# Patient Record
Sex: Male | Born: 1955 | Hispanic: No | Marital: Married | Smoking: Never smoker
Health system: Southern US, Community
[De-identification: ages and names within clinical notes are randomized; demographics above are authoritative.]

## PROBLEM LIST (undated history)

## (undated) DIAGNOSIS — I10 Essential (primary) hypertension: Secondary | ICD-10-CM

## (undated) DIAGNOSIS — F419 Anxiety disorder, unspecified: Secondary | ICD-10-CM

## (undated) DIAGNOSIS — E119 Type 2 diabetes mellitus without complications: Secondary | ICD-10-CM

## (undated) DIAGNOSIS — E0590 Thyrotoxicosis, unspecified without thyrotoxic crisis or storm: Secondary | ICD-10-CM

## (undated) DIAGNOSIS — M1990 Unspecified osteoarthritis, unspecified site: Secondary | ICD-10-CM

## (undated) HISTORY — PX: ANKLE SURGERY: SHX546

## (undated) HISTORY — PX: JOINT REPLACEMENT: SHX530

## (undated) HISTORY — PX: COLON SURGERY: SHX602

## (undated) HISTORY — PX: CORONARY ARTERY BYPASS GRAFT: SHX141

## (undated) HISTORY — PX: ROTATOR CUFF REPAIR: SHX139

## (undated) HISTORY — PX: TONSILLECTOMY: SUR1361

## (undated) HISTORY — PX: CARDIAC CATHETERIZATION: SHX172

---

## 2015-04-15 HISTORY — PX: BELOW KNEE LEG AMPUTATION: SUR23

## 2015-12-26 ENCOUNTER — Other Ambulatory Visit (INDEPENDENT_AMBULATORY_CARE_PROVIDER_SITE_OTHER): Payer: Self-pay | Admitting: Physician Assistant

## 2015-12-27 NOTE — Pre-Procedure Instructions (Addendum)
Francisco Ibarra  12/27/2015      [facility] - [place], [place] - [address] [place] [address] Phone: [phone] Fax: [phone]  [facility] - [place], [place] - [address] [place] [address] Phone: [phone] Fax: [phone]    Your procedure is scheduled on 01/02/16  [facility] Admitting 1:25 P.M.  Call this number if you have problems the morning of surgery:  [phone]   Remember:  Do not eat food or drink liquids after midnight.  Take these medicines the morning of surgery with A SIP OF WATER  Amlodipine(norvasc),cyclobenzaprine(flexeril) if needed,synthroid,lexapro,nexium,pain  med if needed,   STOP all herbel meds, nsaids (aleve,naproxen,advil,ibuprofen) 7 days prior to surgery starting Today including aspirin, all vitamins /supplements,meloxicam, fish oil  No metformin day of surgery  Follow instructions below about insulin    How to Manage Your Diabetes Before and After Surgery  Why is it important to control my blood sugar before and after surgery? . Improving blood sugar levels before and after surgery helps healing and can limit problems. . A way of improving blood sugar control is eating a healthy diet by: o  Eating less sugar and carbohydrates o  Increasing activity/exercise o  Talking with your doctor about reaching your blood sugar goals . High blood sugars (greater than 180 mg/dL) can raise your risk of infections and slow your recovery, so you will need to focus on controlling your diabetes during the weeks before surgery. . Make sure that the doctor who takes care of your diabetes knows about your planned surgery including the date and location.  How do I manage my blood sugar before surgery? . Check your blood sugar at least 4 times a day, starting 2 days before surgery, to make sure  that the level is not too high or low. o Check your blood sugar the morning of your surgery when you wake up and every 2 hours until you get to the Short Stay unit. . If your blood sugar is less than 70 mg/dL, you will need to treat for low blood sugar: o Do not take insulin. o Treat a low blood sugar (less than 70 mg/dL) with  cup of clear juice (cranberry or apple), 4 glucose tablets, OR glucose gel. o Recheck blood sugar in 15 minutes after treatment (to make sure it is greater than 70 mg/dL). If your blood sugar is not greater than 70 mg/dL on recheck, call [phone] for further instructions. . Report your blood sugar to the short stay nurse when you get to Short Stay.  . If you are admitted to the hospital after surgery: o Your blood sugar will be checked by the staff and you will probably be given insulin after surgery (instead of oral diabetes medicines) to make sure you have good blood sugar levels. o The goal for blood sugar control after surgery is 80-180 mg/dL.  WHAT DO I DO ABOUT MY DIABETES MEDICATION?  Patient to discuss with Endocrinologist about insulin pump   Other Instructions:          Patient Signature:  Date:   Nurse Signature:  Date:   Reviewed and Endorsed by [facility] Patient Education Committee, August 2015    Do not wear jewelry  Do not wear lotions, powders, or cologne, or deodorant.  Men may shave face and neck.  Do not bring valuables to the  hospital.  [facility] is not responsible for any belongings or valuables.  Contacts, dentures or bridgework may not be worn into surgery.  Leave your suitcase in the car.  After surgery it may be brought to your room.  For patients admitted to the hospital, discharge time will be determined by your treatment team.  Patients discharged the day of surgery will not be allowed to drive home.   Special instructions:   Special Instructions: [place] - Preparing for Surgery  Before surgery, you can play  an important role.  Because skin is not sterile, your skin needs to be as free of germs as possible.  You can reduce the number of germs on you skin by washing with CHG (chlorahexidine gluconate) soap before surgery.  CHG is an antiseptic cleaner which kills germs and bonds with the skin to continue killing germs even after washing.  Please DO NOT use if you have an allergy to CHG or antibacterial soaps.  If your skin becomes reddened/irritated stop using the CHG and inform your nurse when you arrive at Short Stay.  Do not shave (including legs and underarms) for at least 48 hours prior to the first CHG shower.  You may shave your face.  Please follow these instructions carefully:   1.  Shower with CHG Soap the night before surgery and the morning of Surgery.  2.  If you choose to wash your hair, wash your hair first as usual with your normal shampoo.  3.  After you shampoo, rinse your hair and body thoroughly to remove the Shampoo.  4.  Use CHG as you would any other liquid soap.  You can apply chg directly  to the skin and wash gently with scrungie or a clean washcloth.  5.  Apply the CHG Soap to your body ONLY FROM THE NECK DOWN.  Do not use on open wounds or open sores.  Avoid contact with your eyes ears, mouth and genitals (private parts).  Wash genitals (private parts)       with your normal soap.  6.  Wash thoroughly, paying special attention to the area where your surgery will be performed.  7.  Thoroughly rinse your body with warm water from the neck down.  8.  DO NOT shower/wash with your normal soap after using and rinsing off the CHG Soap.  9.  Pat yourself dry with a clean towel.            10.  Wear clean pajamas.            11.  Place clean sheets on your bed the night of your first shower and do not sleep with pets.  Day of Surgery  Do not apply any lotions/deodorants the morning of surgery.  Please wear clean clothes to the hospital/surgery center.  Please read over the fact  sheets that you were given.

## 2015-12-28 ENCOUNTER — Encounter (HOSPITAL_COMMUNITY): Payer: Self-pay

## 2015-12-28 ENCOUNTER — Encounter (HOSPITAL_COMMUNITY)
Admission: RE | Admit: 2015-12-28 | Discharge: 2015-12-28 | Disposition: A | Discharge status: Home / Self Care | Payer: BLUE CROSS/BLUE SHIELD | Source: Ambulatory Visit | Attending: Orthopaedic Surgery | Admitting: Orthopaedic Surgery

## 2015-12-28 DIAGNOSIS — E119 Type 2 diabetes mellitus without complications: Secondary | ICD-10-CM | POA: Insufficient documentation

## 2015-12-28 DIAGNOSIS — Z01818 Encounter for other preprocedural examination: Secondary | ICD-10-CM | POA: Insufficient documentation

## 2015-12-28 DIAGNOSIS — I10 Essential (primary) hypertension: Secondary | ICD-10-CM | POA: Diagnosis not present

## 2015-12-28 HISTORY — DX: Unspecified osteoarthritis, unspecified site: M19.90

## 2015-12-28 HISTORY — DX: Type 2 diabetes mellitus without complications: E11.9

## 2015-12-28 HISTORY — DX: Thyrotoxicosis, unspecified without thyrotoxic crisis or storm: E05.90

## 2015-12-28 HISTORY — DX: Essential (primary) hypertension: I10

## 2015-12-28 HISTORY — DX: Anxiety disorder, unspecified: F41.9

## 2015-12-28 LAB — BASIC METABOLIC PANEL
ANION GAP: 12 (ref 5–15)
BUN: 14 mg/dL (ref 6–20)
CALCIUM: 10.1 mg/dL (ref 8.9–10.3)
CO2: 21 mmol/L — ABNORMAL LOW (ref 22–32)
Chloride: 103 mmol/L (ref 101–111)
Creatinine, Ser: 1.02 mg/dL (ref 0.61–1.24)
GLUCOSE: 145 mg/dL — AB (ref 65–99)
POTASSIUM: 5 mmol/L (ref 3.5–5.1)
Sodium: 136 mmol/L (ref 135–145)

## 2015-12-28 LAB — CBC
HCT: 40.4 % (ref 39.0–52.0)
Hemoglobin: 14 g/dL (ref 13.0–17.0)
MCH: 31.5 pg (ref 26.0–34.0)
MCHC: 34.7 g/dL (ref 30.0–36.0)
MCV: 91 fL (ref 78.0–100.0)
PLATELETS: 139 10*3/uL — AB (ref 150–400)
RBC: 4.44 MIL/uL (ref 4.22–5.81)
RDW: 14.7 % (ref 11.5–15.5)
WBC: 10.4 10*3/uL (ref 4.0–10.5)

## 2015-12-28 LAB — SURGICAL PCR SCREEN
MRSA, PCR: NEGATIVE
STAPHYLOCOCCUS AUREUS: NEGATIVE

## 2015-12-28 LAB — GLUCOSE, CAPILLARY: Glucose-Capillary: 133 mg/dL — ABNORMAL HIGH (ref 65–99)

## 2015-12-28 NOTE — Progress Notes (Addendum)
[place] dr[facility]p" [place] [facility] [place]. Has had a stress test, echo, and cath through [facility]. Records requested.  Endocrinologist is Dr. Jolyne LoaLeavy. States average blood sugar 119. Patient stated he wanted to call endocrinologist today to find out what to do about his insulin pump. [facility] [place] [facility] [place].

## 2015-12-28 NOTE — Progress Notes (Signed)
   12/28/15 0857  OBSTRUCTIVE SLEEP APNEA  Have you ever been diagnosed with sleep apnea through a sleep study? Yes  If yes, do you have and use a CPAP or BPAP machine every night? 0  Do you snore loudly (loud enough to be heard through closed doors)?  1  Do you often feel tired, fatigued, or sleepy during the daytime (such as falling asleep during driving or talking to someone)? 0  Has anyone observed you stop breathing during your sleep? 0  Do you have, or are you being treated for high blood pressure? 1  BMI more than 35 kg/m2? 1  Age > 50 (1-yes) 1  Neck circumference greater than:Male 16 inches or larger, Male 17inches or larger? 1  Male Gender (Yes=1) 1  Obstructive Sleep Apnea Score 6  Score 5 or greater  Results sent to PCP

## 2015-12-29 LAB — HEMOGLOBIN A1C
HEMOGLOBIN A1C: 6.3 % — AB (ref 4.8–5.6)
Mean Plasma Glucose: 134 mg/dL

## 2015-12-29 NOTE — Progress Notes (Signed)
Anesthesia Chart Review:  Pt is a 60 year old male scheduled for R total hip arthroplasty on 01/02/2016 with Doneen Poissonhristopher Blackman, MD.   - Endocrinologist is Crista CurbMatthew Levy, MD (notes in care everywhere) - [facility]  PMH includes:  CAD ([facility] [place] HTN, DM, hypothyroidism. Never smoker. BMI 39. 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   Medications include: amlodipine, ASA, lipitor, nexium, humalog on an insulin pump, lisinopril, metformin, synthroid, victoza.   Preoperative labs reviewed.  HgbA1c 6.3, glucose 145  EKG 12/28/15: NSR. LAD.   Stress echo 06/17/13:   Normal resting wall motion and no stress-induced wall motion abnormality. No inducible ischemia. The study was technically difficult. LV is normal in size. Mild concentric LVH with normal wall motion and EF 55-60%. Unable to adequately determine diastolic dysfunction. RVEF is normal.  [facility] but he hasn't seen cardiology since 2015.  Appears last office visit was 03/2013 [place] (notes in care everywhere). Denies active CV sx.   Reviewed case with Dr. Aleene DavidsonE. Fitzgerald. If no changes, I anticipate pt can proceed with surgery as scheduled.   Rica Mastngela Bartosz Luginbill, FNP-BC [facility]iology Phone: [phone] 12/29/2015 5:02 PM

## 2016-01-01 MED ORDER — DEXTROSE 5 % IV SOLN
3.0000 g | INTRAVENOUS | Status: AC
  Administered 2016-01-02: 15:00:00 via INTRAVENOUS
  Administered 2016-01-02: 1 g via INTRAVENOUS
  Filled 2016-01-01: qty 3000

## 2016-01-02 ENCOUNTER — Inpatient Hospital Stay (HOSPITAL_COMMUNITY): Payer: BLUE CROSS/BLUE SHIELD | Admitting: Anesthesiology

## 2016-01-02 ENCOUNTER — Inpatient Hospital Stay (HOSPITAL_COMMUNITY): Payer: BLUE CROSS/BLUE SHIELD

## 2016-01-02 ENCOUNTER — Inpatient Hospital Stay (HOSPITAL_COMMUNITY): Payer: BLUE CROSS/BLUE SHIELD | Admitting: Emergency Medicine

## 2016-01-02 ENCOUNTER — Encounter (HOSPITAL_COMMUNITY)
Admission: RE | Disposition: A | Discharge status: Home Health | Payer: Self-pay | Source: Ambulatory Visit | Attending: Orthopaedic Surgery

## 2016-01-02 ENCOUNTER — Encounter (HOSPITAL_COMMUNITY): Payer: Self-pay | Admitting: *Deleted

## 2016-01-02 ENCOUNTER — Inpatient Hospital Stay (HOSPITAL_COMMUNITY)
Admission: RE | Admit: 2016-01-02 | Discharge: 2016-01-05 | DRG: 470 | Disposition: A | Discharge status: Home Health | Payer: BLUE CROSS/BLUE SHIELD | Source: Ambulatory Visit | Attending: Orthopaedic Surgery | Admitting: Orthopaedic Surgery

## 2016-01-02 DIAGNOSIS — Z7982 Long term (current) use of aspirin: Secondary | ICD-10-CM

## 2016-01-02 DIAGNOSIS — Z89512 Acquired absence of left leg below knee: Secondary | ICD-10-CM

## 2016-01-02 DIAGNOSIS — M1611 Unilateral primary osteoarthritis, right hip: Principal | ICD-10-CM

## 2016-01-02 DIAGNOSIS — E0590 Thyrotoxicosis, unspecified without thyrotoxic crisis or storm: Secondary | ICD-10-CM | POA: Diagnosis present

## 2016-01-02 DIAGNOSIS — E669 Obesity, unspecified: Secondary | ICD-10-CM | POA: Diagnosis present

## 2016-01-02 DIAGNOSIS — E119 Type 2 diabetes mellitus without complications: Secondary | ICD-10-CM | POA: Diagnosis present

## 2016-01-02 DIAGNOSIS — Z6839 Body mass index (BMI) 39.0-39.9, adult: Secondary | ICD-10-CM | POA: Diagnosis not present

## 2016-01-02 DIAGNOSIS — Z79899 Other long term (current) drug therapy: Secondary | ICD-10-CM | POA: Diagnosis not present

## 2016-01-02 DIAGNOSIS — Z79891 Long term (current) use of opiate analgesic: Secondary | ICD-10-CM | POA: Diagnosis not present

## 2016-01-02 DIAGNOSIS — Z885 Allergy status to narcotic agent status: Secondary | ICD-10-CM | POA: Diagnosis not present

## 2016-01-02 DIAGNOSIS — Z794 Long term (current) use of insulin: Secondary | ICD-10-CM

## 2016-01-02 DIAGNOSIS — F419 Anxiety disorder, unspecified: Secondary | ICD-10-CM | POA: Diagnosis present

## 2016-01-02 DIAGNOSIS — Z419 Encounter for procedure for purposes other than remedying health state, unspecified: Secondary | ICD-10-CM

## 2016-01-02 DIAGNOSIS — Z96641 Presence of right artificial hip joint: Secondary | ICD-10-CM

## 2016-01-02 DIAGNOSIS — I10 Essential (primary) hypertension: Secondary | ICD-10-CM | POA: Diagnosis present

## 2016-01-02 HISTORY — PX: TOTAL HIP ARTHROPLASTY: SHX124

## 2016-01-02 LAB — GLUCOSE, CAPILLARY
GLUCOSE-CAPILLARY: 146 mg/dL — AB (ref 65–99)
Glucose-Capillary: 223 mg/dL — ABNORMAL HIGH (ref 65–99)
Glucose-Capillary: 148 mg/dL — ABNORMAL HIGH (ref 65–99)

## 2016-01-02 SURGERY — ARTHROPLASTY, HIP, TOTAL, ANTERIOR APPROACH
Anesthesia: Monitor Anesthesia Care | Laterality: Right

## 2016-01-02 MED ORDER — CHLORHEXIDINE GLUCONATE 4 % EX LIQD: 60.0000 mL | Freq: Once | CUTANEOUS | Status: DC

## 2016-01-02 MED ORDER — PROMETHAZINE HCL 25 MG/ML IJ SOLN: 6.2500 mg | INTRAMUSCULAR | Status: DC | PRN

## 2016-01-02 MED ORDER — HYDROMORPHONE HCL 1 MG/ML IJ SOLN: 1.0000 mg | INTRAMUSCULAR | Status: DC | PRN

## 2016-01-02 MED ORDER — LIDOCAINE 2% (20 MG/ML) 5 ML SYRINGE
INTRAMUSCULAR | Status: AC
  Filled 2016-01-02: qty 5

## 2016-01-02 MED ORDER — PHENYLEPHRINE 40 MCG/ML (10ML) SYRINGE FOR IV PUSH (FOR BLOOD PRESSURE SUPPORT)
PREFILLED_SYRINGE | INTRAVENOUS | Status: DC | PRN
  Administered 2016-01-02: 80 ug via INTRAVENOUS

## 2016-01-02 MED ORDER — AMITRIPTYLINE HCL 10 MG PO TABS
10.0000 mg | ORAL_TABLET | Freq: Every day | ORAL | Status: DC
  Administered 2016-01-02 – 2016-01-04 (×3): 10 mg via ORAL
  Filled 2016-01-02 (×3): qty 1

## 2016-01-02 MED ORDER — ZOLPIDEM TARTRATE 5 MG PO TABS: 5.0000 mg | ORAL_TABLET | Freq: Every evening | ORAL | Status: DC | PRN

## 2016-01-02 MED ORDER — METOCLOPRAMIDE HCL 5 MG PO TABS
5.0000 mg | ORAL_TABLET | Freq: Three times a day (TID) | ORAL | Status: DC | PRN
Start: 2016-01-02 — End: 2016-01-05

## 2016-01-02 MED ORDER — ASPIRIN 81 MG PO CHEW
81.0000 mg | CHEWABLE_TABLET | Freq: Two times a day (BID) | ORAL | Status: DC
  Administered 2016-01-02 – 2016-01-05 (×6): 81 mg via ORAL
  Filled 2016-01-02 (×6): qty 1

## 2016-01-02 MED ORDER — BUPIVACAINE IN DEXTROSE 0.75-8.25 % IT SOLN
INTRATHECAL | Status: DC | PRN
  Administered 2016-01-02: 15 mg via INTRATHECAL

## 2016-01-02 MED ORDER — LEVOTHYROXINE SODIUM 100 MCG PO TABS
200.0000 ug | ORAL_TABLET | Freq: Every day | ORAL | Status: DC
  Administered 2016-01-03 – 2016-01-05 (×3): 200 ug via ORAL
  Filled 2016-01-02 (×3): qty 2

## 2016-01-02 MED ORDER — METHOCARBAMOL 1000 MG/10ML IJ SOLN
500.0000 mg | Freq: Four times a day (QID) | INTRAVENOUS | Status: DC | PRN
  Filled 2016-01-02: qty 5

## 2016-01-02 MED ORDER — LIRAGLUTIDE 18 MG/3ML ~~LOC~~ SOPN
1.8000 mg | PEN_INJECTOR | Freq: Every day | SUBCUTANEOUS | Status: DC
  Administered 2016-01-03 – 2016-01-05 (×3): 1.8 mg via SUBCUTANEOUS

## 2016-01-02 MED ORDER — METFORMIN HCL ER 500 MG PO TB24
1000.0000 mg | ORAL_TABLET | Freq: Two times a day (BID) | ORAL | Status: DC
  Administered 2016-01-03 – 2016-01-05 (×5): 1000 mg via ORAL
  Filled 2016-01-02 (×5): qty 2

## 2016-01-02 MED ORDER — ONDANSETRON HCL 4 MG/2ML IJ SOLN: 4.0000 mg | Freq: Four times a day (QID) | INTRAMUSCULAR | Status: DC | PRN

## 2016-01-02 MED ORDER — DIPHENHYDRAMINE HCL 12.5 MG/5ML PO ELIX
12.5000 mg | ORAL_SOLUTION | ORAL | Status: DC | PRN
  Filled 2016-01-02: qty 10

## 2016-01-02 MED ORDER — ESCITALOPRAM OXALATE 10 MG PO TABS
10.0000 mg | ORAL_TABLET | Freq: Every day | ORAL | Status: DC
  Administered 2016-01-02 – 2016-01-05 (×4): 10 mg via ORAL
  Filled 2016-01-02 (×4): qty 1

## 2016-01-02 MED ORDER — ACETAMINOPHEN 650 MG RE SUPP: 650.0000 mg | Freq: Four times a day (QID) | RECTAL | Status: DC | PRN

## 2016-01-02 MED ORDER — HYDROMORPHONE HCL 2 MG/ML IJ SOLN
1.0000 mg | INTRAMUSCULAR | Status: DC | PRN
Start: 2016-01-02 — End: 2016-01-05
  Administered 2016-01-02: 1 mg via INTRAVENOUS
  Filled 2016-01-02: qty 1

## 2016-01-02 MED ORDER — FENTANYL CITRATE (PF) 100 MCG/2ML IJ SOLN
INTRAMUSCULAR | Status: DC | PRN
Start: 2016-01-02 — End: 2016-01-02
  Administered 2016-01-02: 100 ug via INTRAVENOUS

## 2016-01-02 MED ORDER — LACTATED RINGERS IV SOLN
INTRAVENOUS | Status: DC
  Administered 2016-01-02 (×2): via INTRAVENOUS

## 2016-01-02 MED ORDER — PHENOL 1.4 % MT LIQD: 1.0000 | OROMUCOSAL | Status: DC | PRN

## 2016-01-02 MED ORDER — PHENYLEPHRINE HCL 10 MG/ML IJ SOLN
INTRAMUSCULAR | Status: AC
  Filled 2016-01-02: qty 1

## 2016-01-02 MED ORDER — DOCUSATE SODIUM 100 MG PO CAPS
100.0000 mg | ORAL_CAPSULE | Freq: Two times a day (BID) | ORAL | Status: DC
  Administered 2016-01-02 – 2016-01-05 (×6): 100 mg via ORAL
  Filled 2016-01-02 (×6): qty 1

## 2016-01-02 MED ORDER — INSULIN PUMP
Freq: Three times a day (TID) | SUBCUTANEOUS | Status: DC
  Administered 2016-01-02: 22:00:00 via SUBCUTANEOUS
  Administered 2016-01-03: 25 via SUBCUTANEOUS
  Administered 2016-01-03: 37 via SUBCUTANEOUS
  Administered 2016-01-03: 12 via SUBCUTANEOUS
  Administered 2016-01-03: 28 via SUBCUTANEOUS
  Administered 2016-01-04: 13 via SUBCUTANEOUS
  Administered 2016-01-04: 25 via SUBCUTANEOUS
  Administered 2016-01-04 – 2016-01-05 (×2): via SUBCUTANEOUS
  Administered 2016-01-05: 3 via SUBCUTANEOUS
  Filled 2016-01-02: qty 1

## 2016-01-02 MED ORDER — HYDROMORPHONE HCL 1 MG/ML IJ SOLN: 0.2500 mg | INTRAMUSCULAR | Status: DC | PRN

## 2016-01-02 MED ORDER — ACETAMINOPHEN 325 MG PO TABS
650.0000 mg | ORAL_TABLET | Freq: Four times a day (QID) | ORAL | Status: DC | PRN
  Administered 2016-01-03: 650 mg via ORAL
  Filled 2016-01-02: qty 2

## 2016-01-02 MED ORDER — CEFAZOLIN SODIUM-DEXTROSE 2-4 GM/100ML-% IV SOLN
2.0000 g | Freq: Four times a day (QID) | INTRAVENOUS | Status: AC
Start: 2016-01-02 — End: 2016-01-03
  Administered 2016-01-02 – 2016-01-03 (×2): 2 g via INTRAVENOUS
  Filled 2016-01-02 (×2): qty 100

## 2016-01-02 MED ORDER — GABAPENTIN 400 MG PO CAPS
800.0000 mg | ORAL_CAPSULE | Freq: Three times a day (TID) | ORAL | Status: DC
  Administered 2016-01-02 – 2016-01-05 (×8): 800 mg via ORAL
  Filled 2016-01-02 (×8): qty 2

## 2016-01-02 MED ORDER — ADULT MULTIVITAMIN W/MINERALS CH
1.0000 | ORAL_TABLET | Freq: Every day | ORAL | Status: DC
  Administered 2016-01-02 – 2016-01-05 (×4): 1 via ORAL
  Filled 2016-01-02 (×4): qty 1

## 2016-01-02 MED ORDER — FENTANYL CITRATE (PF) 100 MCG/2ML IJ SOLN
INTRAMUSCULAR | Status: AC
  Filled 2016-01-02: qty 2

## 2016-01-02 MED ORDER — ALUM & MAG HYDROXIDE-SIMETH 200-200-20 MG/5ML PO SUSP: 30.0000 mL | ORAL | Status: DC | PRN

## 2016-01-02 MED ORDER — METHOCARBAMOL 500 MG PO TABS
500.0000 mg | ORAL_TABLET | Freq: Four times a day (QID) | ORAL | Status: DC | PRN
  Administered 2016-01-03 – 2016-01-05 (×6): 500 mg via ORAL
  Filled 2016-01-02 (×6): qty 1

## 2016-01-02 MED ORDER — ONDANSETRON HCL 4 MG PO TABS: 4.0000 mg | ORAL_TABLET | Freq: Four times a day (QID) | ORAL | Status: DC | PRN

## 2016-01-02 MED ORDER — 0.9 % SODIUM CHLORIDE (POUR BTL) OPTIME
TOPICAL | Status: DC | PRN
  Administered 2016-01-02: 1000 mL

## 2016-01-02 MED ORDER — MIDAZOLAM HCL 2 MG/2ML IJ SOLN
INTRAMUSCULAR | Status: AC
  Filled 2016-01-02: qty 2

## 2016-01-02 MED ORDER — SODIUM CHLORIDE 0.9 % IV SOLN
INTRAVENOUS | Status: DC
  Administered 2016-01-03: 02:00:00 via INTRAVENOUS

## 2016-01-02 MED ORDER — MENTHOL 3 MG MT LOZG: 1.0000 | LOZENGE | OROMUCOSAL | Status: DC | PRN

## 2016-01-02 MED ORDER — ZOLPIDEM TARTRATE 5 MG PO TABS
10.0000 mg | ORAL_TABLET | Freq: Every day | ORAL | Status: DC
  Administered 2016-01-02 – 2016-01-04 (×3): 10 mg via ORAL
  Filled 2016-01-02 (×3): qty 2

## 2016-01-02 MED ORDER — OXYCODONE HCL 5 MG PO TABS
5.0000 mg | ORAL_TABLET | ORAL | Status: DC | PRN
  Administered 2016-01-03 – 2016-01-04 (×8): 10 mg via ORAL
  Filled 2016-01-02 (×8): qty 2

## 2016-01-02 MED ORDER — PHENYLEPHRINE HCL 10 MG/ML IJ SOLN
INTRAMUSCULAR | Status: DC | PRN
  Administered 2016-01-02: 40 ug/min via INTRAVENOUS

## 2016-01-02 MED ORDER — PANTOPRAZOLE SODIUM 40 MG PO TBEC
40.0000 mg | DELAYED_RELEASE_TABLET | Freq: Every day | ORAL | Status: DC
  Administered 2016-01-03 – 2016-01-05 (×3): 40 mg via ORAL
  Filled 2016-01-02 (×3): qty 1

## 2016-01-02 MED ORDER — PROPOFOL 500 MG/50ML IV EMUL
INTRAVENOUS | Status: DC | PRN
  Administered 2016-01-02: 50 ug/kg/min via INTRAVENOUS

## 2016-01-02 MED ORDER — ATORVASTATIN CALCIUM 40 MG PO TABS
40.0000 mg | ORAL_TABLET | Freq: Every day | ORAL | Status: DC
  Administered 2016-01-02 – 2016-01-04 (×3): 40 mg via ORAL
  Filled 2016-01-02 (×3): qty 1

## 2016-01-02 MED ORDER — AMLODIPINE BESYLATE 5 MG PO TABS
5.0000 mg | ORAL_TABLET | Freq: Every day | ORAL | Status: DC
  Administered 2016-01-02 – 2016-01-04 (×3): 5 mg via ORAL
  Filled 2016-01-02 (×3): qty 1

## 2016-01-02 MED ORDER — SODIUM CHLORIDE 0.9 % IR SOLN
Status: DC | PRN
  Administered 2016-01-02: 3000 mL

## 2016-01-02 MED ORDER — METOCLOPRAMIDE HCL 5 MG/ML IJ SOLN: 5.0000 mg | Freq: Three times a day (TID) | INTRAMUSCULAR | Status: DC | PRN

## 2016-01-02 MED ORDER — MIDAZOLAM HCL 5 MG/5ML IJ SOLN
INTRAMUSCULAR | Status: DC | PRN
  Administered 2016-01-02: 2 mg via INTRAVENOUS

## 2016-01-02 MED ORDER — KETOROLAC TROMETHAMINE 15 MG/ML IJ SOLN
7.5000 mg | Freq: Four times a day (QID) | INTRAMUSCULAR | Status: AC
  Administered 2016-01-02 – 2016-01-03 (×4): 7.5 mg via INTRAVENOUS
  Filled 2016-01-02 (×3): qty 1

## 2016-01-02 SURGICAL SUPPLY — 51 items
BENZOIN TINCTURE PRP APPL 2/3 (GAUZE/BANDAGES/DRESSINGS) ×2 IMPLANT
BLADE SAW SGTL 18X1.27X75 (BLADE) ×2 IMPLANT
BLADE SURG ROTATE 9660 (MISCELLANEOUS) IMPLANT
CAPT HIP TOTAL 2 ×2 IMPLANT
CELLS DAT CNTRL 66122 CELL SVR (MISCELLANEOUS) ×1 IMPLANT
CLSR STERI-STRIP ANTIMIC 1/2X4 (GAUZE/BANDAGES/DRESSINGS) ×2 IMPLANT
COVER SURGICAL LIGHT HANDLE (MISCELLANEOUS) ×2 IMPLANT
DRAPE C-ARM 42X72 X-RAY (DRAPES) ×2 IMPLANT
DRAPE STERI IOBAN 125X83 (DRAPES) ×2 IMPLANT
DRAPE U-SHAPE 47X51 STRL (DRAPES) ×6 IMPLANT
DRSG AQUACEL AG ADV 3.5X10 (GAUZE/BANDAGES/DRESSINGS) ×2 IMPLANT
DURAPREP 26ML APPLICATOR (WOUND CARE) ×2 IMPLANT
ELECT BLADE 4.0 EZ CLEAN MEGAD (MISCELLANEOUS) ×2
ELECT BLADE 6.5 EXT (BLADE) IMPLANT
ELECT REM PT RETURN 9FT ADLT (ELECTROSURGICAL) ×2
ELECTRODE BLDE 4.0 EZ CLN MEGD (MISCELLANEOUS) ×1 IMPLANT
ELECTRODE REM PT RTRN 9FT ADLT (ELECTROSURGICAL) ×1 IMPLANT
FACESHIELD WRAPAROUND (MASK) ×4 IMPLANT
GAUZE XEROFORM 1X8 LF (GAUZE/BANDAGES/DRESSINGS) ×2 IMPLANT
GLOVE BIOGEL PI IND STRL 8 (GLOVE) ×2 IMPLANT
GLOVE BIOGEL PI INDICATOR 8 (GLOVE) ×2
GLOVE ECLIPSE 8.0 STRL XLNG CF (GLOVE) ×2 IMPLANT
GLOVE ORTHO TXT STRL SZ7.5 (GLOVE) ×4 IMPLANT
GOWN STRL REUS W/ TWL LRG LVL3 (GOWN DISPOSABLE) ×2 IMPLANT
GOWN STRL REUS W/ TWL XL LVL3 (GOWN DISPOSABLE) ×2 IMPLANT
GOWN STRL REUS W/TWL LRG LVL3 (GOWN DISPOSABLE) ×2
GOWN STRL REUS W/TWL XL LVL3 (GOWN DISPOSABLE) ×2
HANDPIECE INTERPULSE COAX TIP (DISPOSABLE) ×1
KIT BASIN OR (CUSTOM PROCEDURE TRAY) ×2 IMPLANT
KIT ROOM TURNOVER OR (KITS) ×2 IMPLANT
MANIFOLD NEPTUNE II (INSTRUMENTS) ×2 IMPLANT
NS IRRIG 1000ML POUR BTL (IV SOLUTION) ×2 IMPLANT
PACK TOTAL JOINT (CUSTOM PROCEDURE TRAY) ×2 IMPLANT
PAD ARMBOARD 7.5X6 YLW CONV (MISCELLANEOUS) ×2 IMPLANT
RTRCTR WOUND ALEXIS 18CM MED (MISCELLANEOUS) ×2
SET HNDPC FAN SPRY TIP SCT (DISPOSABLE) ×1 IMPLANT
STAPLER VISISTAT 35W (STAPLE) IMPLANT
STRIP CLOSURE SKIN 1/2X4 (GAUZE/BANDAGES/DRESSINGS) ×4 IMPLANT
SUT ETHIBOND NAB CT1 #1 30IN (SUTURE) ×2 IMPLANT
SUT MNCRL AB 4-0 PS2 18 (SUTURE) IMPLANT
SUT VIC AB 0 CT1 27 (SUTURE) ×1
SUT VIC AB 0 CT1 27XBRD ANBCTR (SUTURE) ×1 IMPLANT
SUT VIC AB 1 CT1 27 (SUTURE) ×1
SUT VIC AB 1 CT1 27XBRD ANBCTR (SUTURE) ×1 IMPLANT
SUT VIC AB 2-0 CT1 27 (SUTURE) ×1
SUT VIC AB 2-0 CT1 TAPERPNT 27 (SUTURE) ×1 IMPLANT
TOWEL OR 17X24 6PK STRL BLUE (TOWEL DISPOSABLE) ×2 IMPLANT
TOWEL OR 17X26 10 PK STRL BLUE (TOWEL DISPOSABLE) ×2 IMPLANT
TRAY CATH 16FR W/PLASTIC CATH (SET/KITS/TRAYS/PACK) ×2 IMPLANT
TRAY FOLEY CATH 16FRSI W/METER (SET/KITS/TRAYS/PACK) IMPLANT
WATER STERILE IRR 1000ML POUR (IV SOLUTION) IMPLANT

## 2016-01-02 NOTE — H&P (Signed)
TOTAL HIP ADMISSION H&P  Patient is admitted for right total hip arthroplasty.  Subjective:  Chief Complaint: right hip pain  HPI: Francisco Ibarra, 60 y.o. male, has a history of pain and functional disability in the right hip(s) due to arthritis and patient has failed non-surgical conservative treatments for greater than 12 weeks to include NSAID's and/or analgesics, corticosteriod injections, flexibility and strengthening excercises, use of assistive devices, weight reduction as appropriate and activity modification.  Onset of symptoms was gradual starting 2 years ago with rapidlly worsening course since that time.The patient noted no past surgery on the right hip(s).  Patient currently rates pain in the right hip at 10 out of 10 with activity. Patient has night pain, worsening of pain with activity and weight bearing, trendelenberg gait, pain that interfers with activities of daily living and pain with passive range of motion. Patient has evidence of subchondral cysts, subchondral sclerosis, periarticular osteophytes and joint space narrowing.by imaging studies. This condition presents safety issues increasing the risk of falls.  There is no current active infection.  Patient Active Problem List   Diagnosis Date Noted  . Unilateral primary osteoarthritis, right hip 01/02/2016   Past Medical History:  Diagnosis Date  . Anxiety   . Arthritis   . Diabetes mellitus without complication (HCC)   . Hypertension   . Hyperthyroidism     Past Surgical History:  Procedure Laterality Date  . ANKLE SURGERY     x4  . BELOW KNEE LEG AMPUTATION Left 04/2015  . CARDIAC CATHETERIZATION    . COLON SURGERY     exploratory lap---diverticulitis/colostomy/colostomy reversal  . CORONARY ARTERY BYPASS GRAFT    . JOINT REPLACEMENT    . ROTATOR CUFF REPAIR Right   . TONSILLECTOMY      Prescriptions Prior to Admission  Medication Sig Dispense Refill Last Dose  . amitriptyline (ELAVIL) 10 MG tablet Take 10  mg by mouth at bedtime.     Marland Kitchen. amLODipine (NORVASC) 5 MG tablet Take 5 mg by mouth at bedtime.  0   . aspirin EC 81 MG tablet Take 81 mg by mouth daily.     Marland Kitchen. atorvastatin (LIPITOR) 40 MG tablet Take 40 mg by mouth at bedtime.  2   . BIOTIN PO Take 1 tablet by mouth daily.     . cyclobenzaprine (FLEXERIL) 10 MG tablet Take 10 mg by mouth 2 (two) times daily as needed for muscle spasms.  0   . escitalopram (LEXAPRO) 10 MG tablet Take 10 mg by mouth daily.  0   . esomeprazole (NEXIUM) 20 MG capsule Take 20 mg by mouth daily.  0   . gabapentin (NEURONTIN) 800 MG tablet Take 800 mg by mouth 3 (three) times daily.     Marland Kitchen. HYDROcodone-acetaminophen (NORCO) 10-325 MG tablet Take 1 tablet by mouth 4 (four) times daily.  0   . Insulin Human (INSULIN PUMP) SOLN Inject into the skin. Humalog 100/ml basal rate 3.25 units per hour, bolus based on carb intake     . insulin lispro (HUMALOG) 100 UNIT/ML injection Inject into the skin. Insulin Pump Bolus based on carb intake , basal 3.25 units per hour     . lisinopril (PRINIVIL,ZESTRIL) 40 MG tablet Take 40 mg by mouth at bedtime.     Marland Kitchen. MAGNESIUM PO Take 1 tablet by mouth daily.     . meloxicam (MOBIC) 15 MG tablet Take 15 mg by mouth daily.  2   . metFORMIN (GLUCOPHAGE-XR) 500 MG 24 hr tablet  Take 1,000 mg by mouth 2 (two) times daily.  0   . Multiple Vitamin (MULTIVITAMIN WITH MINERALS) TABS tablet Take 1 tablet by mouth daily.     . Omega-3 Fatty Acids (FISH OIL PO) Take 1 capsule by mouth daily.     Marland Kitchen. SYNTHROID 200 MCG tablet Take 200 mcg by mouth daily.  2   . VICTOZA 18 MG/3ML SOPN Inject 1.8 mg into the skin daily.  1   . ZOHYDRO ER 15 MG C12A Take 15 mg by mouth every 12 (twelve) hours.  0   . zolpidem (AMBIEN) 10 MG tablet Take 10 mg by mouth at bedtime.  0    Allergies  Allergen Reactions  . Codeine Anaphylaxis, Hives and Shortness Of Breath    Social History  Substance Use Topics  . Smoking status: Never Smoker  . Smokeless tobacco: Never Used   . Alcohol use No    No family history on file.   Review of Systems  Musculoskeletal: Positive for joint pain.  All other systems reviewed and are negative.   Objective:  Physical Exam  Constitutional: He is oriented to person, place, and time. He appears well-developed and well-nourished.  HENT:  Head: Normocephalic and atraumatic.  Eyes: EOM are normal. Pupils are equal, round, and reactive to light.  Neck: Normal range of motion. Neck supple.  Cardiovascular: Normal rate and regular rhythm.   Respiratory: Effort normal and breath sounds normal.  GI: Soft. Bowel sounds are normal.  Musculoskeletal:       Right hip: He exhibits decreased range of motion, decreased strength, tenderness and bony tenderness.  Neurological: He is alert and oriented to person, place, and time.  Skin: Skin is warm and dry.  Psychiatric: He has a normal mood and affect.    Vital signs in last 24 hours: Temp:  [98.5 F (36.9 C)] 98.5 F (36.9 C) (12/19 1209) Weight:  [273 lb (123.8 kg)] 273 lb (123.8 kg) (12/19 1209)  Labs:   Estimated body mass index is 39.17 kg/m as calculated from the following:   Height as of 12/28/15: 5\' 10"  (1.778 m).   Weight as of this encounter: 273 lb (123.8 kg).   Imaging Review Plain radiographs demonstrate severe degenerative joint disease of the right hip(s). The bone quality appears to be good for age and reported activity level.  Assessment/Plan:  End stage arthritis, right hip(s)  The patient history, physical examination, clinical judgement of the provider and imaging studies are consistent with end stage degenerative joint disease of the right hip(s) and total hip arthroplasty is deemed medically necessary. The treatment options including medical management, injection therapy, arthroscopy and arthroplasty were discussed at length. The risks and benefits of total hip arthroplasty were presented and reviewed. The risks due to aseptic loosening, infection,  stiffness, dislocation/subluxation,  thromboembolic complications and other imponderables were discussed.  The patient acknowledged the explanation, agreed to proceed with the plan and consent was signed. Patient is being admitted for inpatient treatment for surgery, pain control, PT, OT, prophylactic antibiotics, VTE prophylaxis, progressive ambulation and ADL's and discharge planning.The patient is planning to be discharged home with home health services

## 2016-01-02 NOTE — Anesthesia Procedure Notes (Signed)
Spinal  Patient location during procedure: OR Start time: 01/02/2016 2:08 PM End time: 01/02/2016 2:18 PM Staffing Anesthesiologist: Heather RobertsSINGER, Apryll Hinkle Performed: anesthesiologist  Preanesthetic Checklist Completed: patient identified, surgical consent, pre-op evaluation, timeout performed, IV checked, risks and benefits discussed and monitors and equipment checked Spinal Block Patient position: sitting Prep: DuraPrep Patient monitoring: cardiac monitor, continuous pulse ox and blood pressure Approach: midline Location: L2-3 Injection technique: single-shot Needle Needle type: Whitacre  Needle gauge: 24 G Needle length: 9 cm Additional Notes Functioning IV was confirmed and monitors were applied. Sterile prep and drape, including hand hygiene and sterile gloves were used. The patient was positioned and the spine was prepped. The skin was anesthetized with lidocaine.  Free flow of clear CSF was obtained prior to injecting local anesthetic into the CSF.  The spinal needle aspirated freely following injection.  The needle was carefully withdrawn.  The patient tolerated the procedure well.

## 2016-01-02 NOTE — Transfer of Care (Signed)
Immediate Anesthesia Transfer of Care Note  Patient: Francisco Ibarra  Procedure(s) Performed: Procedure(s): RIGHT TOTAL HIP ARTHROPLASTY ANTERIOR APPROACH (Right)  Patient Location: PACU  Anesthesia Type:MAC and Spinal  Level of Consciousness: awake, alert , oriented and patient cooperative  Airway & Oxygen Therapy: Patient Spontanous Breathing and Patient connected to nasal cannula oxygen  Post-op Assessment: Report given to RN and Post -op Vital signs reviewed and stable  Post vital signs: Reviewed and stable  Last Vitals:  Vitals:   01/02/16 1210 01/02/16 1650  BP: 122/61   Pulse: 80   Resp: 18   Temp:  (P) 36.5 C    Last Pain:  Vitals:   01/02/16 1247  TempSrc:   PainSc: 8          Complications: No apparent anesthesia complications

## 2016-01-02 NOTE — Anesthesia Preprocedure Evaluation (Signed)
Anesthesia Evaluation  Patient identified by MRN, date of birth, ID band Patient awake    Reviewed: Allergy & Precautions, NPO status , Patient's Chart, lab work & pertinent test results  History of Anesthesia Complications Negative for: history of anesthetic complications  Airway Mallampati: II  TM Distance: >3 FB Neck ROM: Full    Dental  (+) Dental Advisory Given   Pulmonary neg pulmonary ROS,    Pulmonary exam normal        Cardiovascular hypertension, + CAD and + CABG  Normal cardiovascular exam     Neuro/Psych negative neurological ROS  negative psych ROS   GI/Hepatic negative GI ROS, Neg liver ROS,   Endo/Other  negative endocrine ROSdiabetes  Renal/GU negative Renal ROS  negative genitourinary   Musculoskeletal negative musculoskeletal ROS (+)   Abdominal   Peds negative pediatric ROS (+)  Hematology negative hematology ROS (+)   Anesthesia Other Findings   Reproductive/Obstetrics negative OB ROS                             Anesthesia Physical Anesthesia Plan  ASA: III  Anesthesia Plan: MAC and Spinal   Post-op Pain Management:    Induction:   Airway Management Planned: Natural Airway and Simple Face Mask  Additional Equipment:   Intra-op Plan:   Post-operative Plan:   Informed Consent: I have reviewed the patients History and Physical, chart, labs and discussed the procedure including the risks, benefits and alternatives for the proposed anesthesia with the patient or authorized representative who has indicated his/her understanding and acceptance.   Dental advisory given  Plan Discussed with: CRNA  Anesthesia Plan Comments:         Anesthesia Quick Evaluation

## 2016-01-02 NOTE — Brief Op Note (Signed)
01/02/2016  4:30 PM  PATIENT:  Louann SjogrenWalter Eilts  60 y.o. male  PRE-OPERATIVE DIAGNOSIS:  severe endstage arthritis right hip  POST-OPERATIVE DIAGNOSIS:  severe endstage arthritis right hip  PROCEDURE:  Procedure(s): RIGHT TOTAL HIP ARTHROPLASTY ANTERIOR APPROACH (Right)  SURGEON:  Surgeon(s) and Role:    * Kathryne Hitchhristopher Y Danen Lapaglia, MD - Primary  PHYSICIAN ASSISTANT: Rexene EdisonGil Clark, PA-C  ANESTHESIA:   spinal  EBL:  Total I/O In: -  Out: 300 [Blood:300]  COUNTS:  YES  DICTATION: .Other Dictation: Dictation Number [id number]  PLAN OF CARE: Admit to inpatient   PATIENT DISPOSITION:  PACU - hemodynamically stable.   Delay start of Pharmacological VTE agent (>24hrs) due to surgical blood loss or risk of bleeding: no

## 2016-01-03 ENCOUNTER — Encounter (HOSPITAL_COMMUNITY): Payer: Self-pay | Admitting: Orthopaedic Surgery

## 2016-01-03 LAB — GLUCOSE, CAPILLARY
GLUCOSE-CAPILLARY: 294 mg/dL — AB (ref 65–99)
Glucose-Capillary: 320 mg/dL — ABNORMAL HIGH (ref 65–99)
Glucose-Capillary: 248 mg/dL — ABNORMAL HIGH (ref 65–99)
Glucose-Capillary: 129 mg/dL — ABNORMAL HIGH (ref 65–99)

## 2016-01-03 LAB — BASIC METABOLIC PANEL
Anion gap: 8 (ref 5–15)
BUN: 14 mg/dL (ref 6–20)
CALCIUM: 8.8 mg/dL — AB (ref 8.9–10.3)
CO2: 22 mmol/L (ref 22–32)
CREATININE: 1.21 mg/dL (ref 0.61–1.24)
Chloride: 101 mmol/L (ref 101–111)
GFR calc Af Amer: >60 mL/min (ref >60)
Glucose, Bld: 311 mg/dL — ABNORMAL HIGH (ref 65–99)
Potassium: 4.9 mmol/L (ref 3.5–5.1)
SODIUM: 131 mmol/L — AB (ref 135–145)

## 2016-01-03 LAB — CBC
HCT: 31.8 % — ABNORMAL LOW (ref 39.0–52.0)
Hemoglobin: 11 g/dL — ABNORMAL LOW (ref 13.0–17.0)
MCH: 31.4 pg (ref 26.0–34.0)
MCHC: 34.6 g/dL (ref 30.0–36.0)
MCV: 90.9 fL (ref 78.0–100.0)
PLATELETS: 126 10*3/uL — AB (ref 150–400)
RBC: 3.5 MIL/uL — AB (ref 4.22–5.81)
RDW: 14.5 % (ref 11.5–15.5)
WBC: 9.6 10*3/uL (ref 4.0–10.5)

## 2016-01-03 NOTE — Anesthesia Postprocedure Evaluation (Addendum)
Anesthesia Post Note  Patient: Francisco Ibarra  Procedure(s) Performed: Procedure(s) (LRB): RIGHT TOTAL HIP ARTHROPLASTY ANTERIOR APPROACH (Right)  Patient location during evaluation: PACU Anesthesia Type: Spinal and MAC Level of consciousness: awake and alert Pain management: pain level controlled Vital Signs Assessment: post-procedure vital signs reviewed and stable Respiratory status: spontaneous breathing and respiratory function stable Cardiovascular status: blood pressure returned to baseline and stable Postop Assessment: spinal receding Anesthetic complications: no                   Andrey Hoobler DANIEL

## 2016-01-03 NOTE — Op Note (Signed)
NAMMardene Sayer:  Kaneko, Justinn              ACCOUNT NO.:  [id number]  MEDICAL RECORD NO.:  [id number]  LOCATION:                                 FACILITY:  PHYSICIAN:  Vanita PandaChristopher Y. Magnus IvanBlackman, M.D.DATE OF BIRTH:  1955/11/13  DATE OF PROCEDURE:  01/02/2016 DATE OF DISCHARGE:                              OPERATIVE REPORT   PREOPERATIVE DIAGNOSES:  Primary osteoarthritis and degenerative joint disease, right hip.  POSTOPERATIVE DIAGNOSES:  Primary osteoarthritis and degenerative joint disease, right hip.  PROCEDURE:  Right total hip arthroplasty through direct anterior approach.  IMPLANTS:  DePuy Sector Gription acetabular component size 56 with a single screw, size 36+ 0 neutral polyethylene liner, size 13 Corail femoral component with varus offset, size 36+ 1.5 ceramic hip ball.  SURGEON:  Vanita PandaChristopher Y. Magnus IvanBlackman, M.D.  ASSISTANT:  Richardean CanalGilbert Clark, PA-C.  ANESTHESIA:  Spinal.  ANTIBIOTICS:  2 g of IV Ancef.  BLOOD LOSS:  300 mL.  COMPLICATIONS:  None.  INDICATIONS:  Mr. Elnora MorrisonChaffin is a very pleasant 60 year old diabetic male with some obesity.  He also has debilitating arthritis involving his right hip.  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  There are cystic and sclerotic changes and periarticular osteophytes.  His pain is daily and it has detrimentally affected his activities of daily living, his quality of life, and his mobility.  He has worked on activity modification, weight loss, as well as blood glucose control.  At this point, he has tried and failed all forms of conservative treatment and does wish to proceed with a total hip arthroplasty.  We talked about the risk of acute blood loss anemia, nerve and vessel injury, fracture, infection, DVT, as well as the goals of decreased pain, improved mobility, and overall improved quality of life.  PROCEDURE DESCRIPTION:  After informed consent was obtained,  appropriate right hip was marked.  He was brought to the operating room.  Spinal anesthesia was obtained while he was on the stretcher.  Traction boots were placed on both his feet.  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  His right side was prepped and draped with DuraPrep and sterile drapes and also placed in traction boot.  Time-out was called to identify the correct patient, correct right hip.  We then made incision just inferior and posterior to the anterior superior iliac spine and carried this obliquely down the leg.  We dissected down tensor fascia lata muscle.  Tensor fascia was then divided longitudinally to proceed with a direct anterior approach to the hip.  We identified and cauterized the circumflex vessels and identified the hip capsule finding significant scarring around the hip capsule.  We basically had to excise the hip capsule into the hip joint due to the significance of his protrusio.  We then made our femoral neck cut almost to the level of the calcar due to a short femoral neck and completed this with an osteotome.  We started this cut with an oscillating saw.  We then placed a corkscrew guide and removed the femoral head in its entirety and found it to be completely devoid of cartilage.  We then cleaned remnants of acetabular labrum and other debris and placed a bent Hohmann over the acetabular rim.  We then began reaming under direct visualization, which was quite difficult from a size 42 all the way up to a size 56.  With all reamers under direct visualization and the last reamer under direct fluoroscopy so we could obtain our depth of reaming, our inclination, and anteversion.  We then placed the real DePuy Sector Gription acetabular component size 56 and a single  screw.  We placed a 36+ 0 neutral polyethylene liner for that size acetabular component.  Attention was then turned to the femur. With the leg externally rotated to 120 degrees, extended and adducted, we were to place a Mueller retractor medially and a Hohmann retractor behind the greater trochanter.  We released and excised the lateral joint capsule and removed scar tissue.  We used a box cutting osteotome the enter the femoral canal and a rongeur to lateralize.  We then began broaching from a size 8 broach using the Corail broaching system up to a size 13.  With the size 13, we trialed a varus offset femoral neck based off his anatomy and a 36+ 1.5 hip ball, reduced this in the acetabulum. We were pleased with stability, offset, and leg length.  We then dislocated the hip and removed the trial components.  We were able to place the real Corail femoral component size 13 with varus offset and the real 36+ 1.5 ceramic hip ball, reduced this in the acetabulum, and it was stable.  We then irrigated the soft tissue with normal saline solution using pulsatile lavage.  There was no joint capsule to close. We closed the tensor fascia with interrupted #1 Vicryl suture followed by 0-Vicryl in the deep tissue, 2-0 Vicryl in subcutaneous tissue, and interrupted staples on the skin.  Xeroform and Aquacel dressing were applied.  He was taken off the fracture table, taken to the recovery room in stable condition.  All final counts were correct.  There were no complications noted.  Of note, Richardean CanalGilbert Clark, PA-C assisted in the entire case.  His assistance was crucial for facilitating all aspects of this case.     Vanita Pandahristopher Y. Magnus IvanBlackman, M.D.   ______________________________ Vanita Pandahristopher Y. Magnus IvanBlackman, M.D.    CYB/MEDQ  D:  01/02/2016  T:  01/03/2016  Job:  [id number]

## 2016-01-03 NOTE — Evaluation (Signed)
Physical Therapy Evaluation Patient Details Name: Francisco SjogrenWalter Abelson MRN: [id number] DOB: 12/04/1955 Today's Date: 01/03/2016   History of Present Illness  Pt is a 60 y.o. male now s/p Rt anterior THA. PMH: Lt BKA, CABG, HTN, DM, anxiety.   Clinical Impression  Pt seen for initial evaluation and treatment including ambulation of 17 feet with rw. Transfers performed from an elevated surface with increased reliance on UEs and surgical LE initially to get Lt prosthesis on. Based upon the patient's current mobility, anticipate D/C to home with family support. Pt motivated and cooperative throughout.     Follow Up Recommendations Home health PT;Supervision for mobility/OOB    Equipment Recommendations  None recommended by PT    Recommendations for Other Services       Precautions / Restrictions Precautions Precautions: Fall Precaution Comments: Lt BKA, has insulin pump Required Braces or Orthoses: Other Brace/Splint Other Brace/Splint: Lt prosthetic Restrictions Weight Bearing Restrictions: Yes RLE Weight Bearing: Weight bearing as tolerated      Mobility  Bed Mobility Overal bed mobility: Needs Assistance Bed Mobility: Supine to Sit     Supine to sit: Min assist     General bed mobility comments: assist with Rt LE needed, pt using rails   Transfers Overall transfer level: Needs assistance Equipment used: Rolling walker (2 wheeled) Transfers: Sit to/from Stand Sit to Stand: Min assist;From elevated surface         General transfer comment: Pt standing X1 to seat prosthesis f/b sitting break and repeat standing for ambulation. Cues for reaching back with sitting. When standing pt having to use bilateral UEs on rw.   Ambulation/Gait Ambulation/Gait assistance: Min guard Ambulation Distance (Feet): 17 Feet Assistive device: Rolling walker (2 wheeled) Gait Pattern/deviations: Step-to pattern Gait velocity: decreased   General Gait Details: no loss of balance during  ambulation.   Stairs            Wheelchair Mobility    Modified Rankin (Stroke Patients Only)       Balance Overall balance assessment: Needs assistance Sitting-balance support: No upper extremity supported Sitting balance-Leahy Scale: Fair     Standing balance support: Bilateral upper extremity supported Standing balance-Leahy Scale: Poor Standing balance comment: using rw                             Pertinent Vitals/Pain Pain Assessment: 0-10 Pain Score: 7  Pain Location: Rt hip Pain Descriptors / Indicators: Aching Pain Intervention(s): Limited activity within patient's tolerance;Monitored during session    Home Living Family/patient expects to be discharged to:: Private residence Living Arrangements: Spouse/significant other;Other relatives Available Help at Discharge: Family;Available 24 hours/day Type of Home: House Home Access: Stairs to enter Entrance Stairs-Rails: None Entrance Stairs-Number of Steps: 1 Home Layout: One level Home Equipment: Crutches;Wheelchair - [employer] Corporationmanual;Walker - 2 wheels Additional Comments: lives with spouse and brother    Prior Function Level of Independence: Independent               Higher education careers adviserHand Dominance        Extremity/Trunk Assessment   Upper Extremity Assessment Upper Extremity Assessment: Overall WFL for tasks assessed    Lower Extremity Assessment Lower Extremity Assessment: RLE deficits/detail RLE Deficits / Details: assist provided to move laterally in bed.        Communication   Communication: No difficulties  Cognition Arousal/Alertness: Awake/alert Behavior During Therapy: WFL for tasks assessed/performed Overall Cognitive Status: Within Functional Limits for tasks assessed  General Comments      Exercises     Assessment/Plan    PT Assessment Patient needs continued PT services  PT Problem List Decreased strength;Decreased range of motion;Decreased activity  tolerance;Decreased balance;Decreased mobility          PT Treatment Interventions DME instruction;Gait training;Stair training;Functional mobility training;Therapeutic activities;Therapeutic exercise;Balance training;Patient/family education    PT Goals (Current goals can be found in the Care Plan section)  Acute Rehab PT Goals Patient Stated Goal: walk, get home, work again PT Goal Formulation: With patient Time For Goal Achievement: 01/17/16 Potential to Achieve Goals: Good    Frequency 7X/week   Barriers to discharge        Co-evaluation               End of Session Equipment Utilized During Treatment: Gait belt (Lt prosthesis) Activity Tolerance: Patient tolerated treatment well;Patient limited by fatigue Patient left: in chair;with call bell/phone within reach Nurse Communication: Mobility status         Time: [id number] PT Time Calculation (min) (ACUTE ONLY): 37 min   Charges:   PT Evaluation $PT Eval Moderate Complexity: 1 Procedure PT Treatments $Gait Training: 8-22 mins   PT G Codes:        Christiane HaBenjamin J. Mace Weinberg, PT, CSCS Pager [phone] Office [phone]  01/03/2016, 3:23 PM

## 2016-01-03 NOTE — Progress Notes (Signed)
Spoke with patient about his insulin pump. Has been on insulin pump for about 10 years.  Basal rates: 3.2 units/hr.  CHO coverage: 1 unit/15 grams CHO States that his last HgbA1C was 6.1%. Normally runs blood sugars of 70-90 mg/dl at home. Site changed on 12/19, located in left abdomen. Patient does have supplies if site needs to be changed. Patient did not cover or correct CHO at noon since only ate a few bites. Knows to cover with meals. Will continue to follow blood sugars while in the hospital. Smith MinceKendra Dunbar Buras RN BSN CDE

## 2016-01-03 NOTE — Progress Notes (Signed)
PT Progress Note  Pt with increased ambulation distance during second PT session. Able to ambulate 50 ft with rw and min guard assistance. Needing elevated surface with standing to don prosthesis initially. Continue to anticipate D/c to home following acute stay. Spouse present for session and supportive throughout.     01/03/16 1529  PT Visit Information  Last PT Received On 01/03/16  Assistance Needed +1  History of Present Illness Pt is a 60 y.o. male now s/p Rt anterior THA. PMH: Lt BKA, CABG, HTN, DM, anxiety.   Precautions  Precautions Fall  Precaution Comments Lt BKA, has insulin pump, HEP provided  Required Braces or Orthoses Other Brace/Splint  Other Brace/Splint Lt BKA prosthetic  Restrictions  Weight Bearing Restrictions Yes  RLE Weight Bearing WBAT  Pain Assessment  Pain Assessment 0-10  Pain Score 6  Pain Location Rt hip  Pain Descriptors / Indicators Aching  Pain Intervention(s) Limited activity within patient's tolerance;Monitored during session  Cognition  Arousal/Alertness Awake/alert  Behavior During Therapy WFL for tasks assessed/performed  Overall Cognitive Status Within Functional Limits for tasks assessed  Bed Mobility  Overal bed mobility Needs Assistance  Bed Mobility Supine to Sit  Supine to sit Min assist  General bed mobility comments assist with Rt LE needed, pt using rails   Transfers  Overall transfer level Needs assistance  Equipment used Rolling walker (2 wheeled)  Transfers Sit to/from Stand  Sit to Stand Min assist;From elevated surface  General transfer comment Having to stand to seat Lt prosthesis, ambulating following. Pt having to use bilateral UEs on rw to stand.   Ambulation/Gait  Ambulation/Gait assistance Min guard  Ambulation Distance (Feet) 50 Feet  Assistive device Rolling walker (2 wheeled)  Gait Pattern/deviations Step-to pattern  General Gait Details working on posture and position in rw tendency to step too far forward.    Gait velocity decreased  Balance  Overall balance assessment Needs assistance  Sitting-balance support No upper extremity supported  Sitting balance-Leahy Scale Fair  Standing balance support Bilateral upper extremity supported  Standing balance-Leahy Scale Poor  Standing balance comment using rw  Exercises  Exercises Total Joint  Total Joint Exercises  Ankle Circles/Pumps AROM;Right;20 reps  [employer] Tim[employer] CompanyQuad Sets Strengthening;Right;10 reps  Short Arc Quad Strengthening;Right;10 reps  Heel Slides AAROM;Right;10 reps  Hip ABduction/ADduction Strengthening;Right;10 reps  PT - End of Session  Equipment Utilized During Treatment Gait belt  Activity Tolerance Patient tolerated treatment well;Patient limited by fatigue  Patient left in chair;with call bell/phone within reach;with nursing/sitter in room;with family/visitor present  Nurse Communication Mobility status  PT - Assessment/Plan  PT Plan Current plan remains appropriate  PT Frequency (ACUTE ONLY) 7X/week  Follow Up Recommendations Home health PT;Supervision for mobility/OOB  PT equipment None recommended by PT  PT Goal Progression  Progress towards PT goals Progressing toward goals  Acute Rehab PT Goals  PT Goal Formulation With patient  Time For Goal Achievement 01/17/16  Potential to Achieve Goals Good  PT Time Calculation  PT Start Time (ACUTE ONLY) 1429  PT Stop Time (ACUTE ONLY) 1502  PT Time Calculation (min) (ACUTE ONLY) 33 min  PT General Charges  $$ ACUTE PT VISIT 1 Procedure  PT Treatments  $Gait Training 8-22 mins  $Therapeutic Exercise 8-22 mins  Christiane HaBenjamin J. Sheva Mcdougle, PT, CSCS Pager [phone] Office [phone]

## 2016-01-03 NOTE — Progress Notes (Signed)
Subjective: 1 Day Post-Op Procedure(s) (LRB): RIGHT TOTAL HIP ARTHROPLASTY ANTERIOR APPROACH (Right) Patient reports pain as moderate.    Objective: Vital signs in last 24 hours: Temp:  [97.7 F (36.5 C)-100.3 F (37.9 C)] 100.3 F (37.9 C) (12/20 0440) Pulse Rate:  [72-102] 102 (12/20 0440) Resp:  [7-18] 16 (12/20 0440) BP: (91-150)/(54-98) 106/66 (12/20 0440) SpO2:  [95 %-100 %] 96 % (12/20 0440) Weight:  [273 lb (123.8 kg)] 273 lb (123.8 kg) (12/19 1209)  Intake/Output from previous day: 12/19 0701 - 12/20 0700 In: 1000 [I.V.:1000] Out: 1100 [Urine:800; Blood:300] Intake/Output this shift: No intake/output data recorded.   Recent Labs  01/03/16 0544  HGB 11.0*    Recent Labs  01/03/16 0544  WBC 9.6  RBC 3.50*  HCT 31.8*  PLT 126*    Recent Labs  01/03/16 0544  NA 131*  K 4.9  CL 101  CO2 22  BUN 14  CREATININE 1.21  GLUCOSE 311*  CALCIUM 8.8*   No results for input(s): LABPT, INR in the last 72 hours.  Intact pulses distally Dorsiflexion/Plantar flexion intact Incision: scant drainage  Assessment/Plan: 1 Day Post-Op Procedure(s) (LRB): RIGHT TOTAL HIP ARTHROPLASTY ANTERIOR APPROACH (Right) Up with therapy  Diabetic Coordinator consult- on insulin pump and difficulty controlling blood glucose.  Kathryne HitchChristopher Y Chauntay Paszkiewicz 01/03/2016, 7:48 AM

## 2016-01-04 ENCOUNTER — Telehealth (INDEPENDENT_AMBULATORY_CARE_PROVIDER_SITE_OTHER): Payer: Self-pay | Admitting: Orthopaedic Surgery

## 2016-01-04 LAB — GLUCOSE, CAPILLARY
GLUCOSE-CAPILLARY: 175 mg/dL — AB (ref 65–99)
GLUCOSE-CAPILLARY: 200 mg/dL — AB (ref 65–99)
Glucose-Capillary: 212 mg/dL — ABNORMAL HIGH (ref 65–99)
Glucose-Capillary: 88 mg/dL (ref 65–99)

## 2016-01-04 MED ORDER — OXYCODONE-ACETAMINOPHEN 5-325 MG PO TABS: 1.0000 | ORAL_TABLET | ORAL | 0 refills | Status: DC | PRN

## 2016-01-04 MED ORDER — ASPIRIN 81 MG PO CHEW: 81.0000 mg | CHEWABLE_TABLET | Freq: Two times a day (BID) | ORAL | 0 refills | Status: AC

## 2016-01-04 MED ORDER — METHOCARBAMOL 500 MG PO TABS: 500.0000 mg | ORAL_TABLET | Freq: Four times a day (QID) | ORAL | 1 refills | Status: DC | PRN

## 2016-01-04 MED ORDER — OXYCODONE-ACETAMINOPHEN 5-325 MG PO TABS
1.0000 | ORAL_TABLET | ORAL | Status: DC | PRN
  Administered 2016-01-04 – 2016-01-05 (×3): 2 via ORAL
  Filled 2016-01-04 (×3): qty 2

## 2016-01-04 NOTE — Telephone Encounter (Signed)
[facility] (dawn) walked in to tell us that pt is with their facility now and wants to know if there's any specific protocol? [place]. Her number is [phone]

## 2016-01-04 NOTE — Telephone Encounter (Signed)
Please advise 

## 2016-01-04 NOTE — Telephone Encounter (Signed)
There is no specific protocol for his hip.

## 2016-01-04 NOTE — Progress Notes (Signed)
Inpatient Diabetes Program Recommendations  AACE/ADA: New Consensus Statement on Inpatient Glycemic Control (2015)  Target Ranges:  Prepandial:   less than 140 mg/dL      Peak postprandial:   less than 180 mg/dL (1-2 hours)      Critically ill patients:  140 - 180 mg/dL   Lab Results  Component Value Date   GLUCAP 175 (H) 01/04/2016   HGBA1C 6.3 (H) 12/28/2015    Review of Glycemic Control Results for Francisco SjogrenCHAFFIN, Jairo (MRN [id number]) as of 01/04/2016 08:03  Ref. Range 01/03/2016 06:48 01/03/2016 11:25 01/03/2016 16:53 01/03/2016 21:36 01/04/2016 06:10  Glucose-Capillary Latest Ref Range: 65 - 99 mg/dL 528294 (H) 413320 (H) 244248 (H) 129 (H) 175 (H)   Inpatient Diabetes Program Recommendations:  Reviewed CBGs and is now doing well after new insulin pump site. Will follow.  Thank you, Billy FischerJudy E. Shaelyn Decarli, RN, MSN, CDE Inpatient Glycemic Control Team Team Pager [phone] (8am-5pm) 01/04/2016 8:04 AM

## 2016-01-04 NOTE — Progress Notes (Signed)
Physical Therapy Treatment Patient Details Name: Francisco Ibarra MRN: [id number] DOB: [birth year] Today's Date: 01/04/2016    History of Present Illness Pt is a 60 y.o. male now s/p Rt anterior THA. PMH: Lt BKA, CABG, HTN, DM, anxiety.     PT Comments    Pt gradually progressing with mobility. Able to ambulate 70 ft with rw and min guard for supine to sitting. Pt requesting return to bed as just got back from sitting in the chair. Pt agrees to get up with nursing for dinner. PT to continue to follow in anticipation of D/C to home.   Follow Up Recommendations  Home health PT;Supervision for mobility/OOB     Equipment Recommendations  None recommended by PT    Recommendations for Other Services       Precautions / Restrictions Precautions Precautions: Fall Precaution Comments: Lt BKA, has insulin pump Required Braces or Orthoses: Other Brace/Splint Other Brace/Splint: Lt BKA prosthetic Restrictions Weight Bearing Restrictions: Yes RLE Weight Bearing: Weight bearing as tolerated    Mobility  Bed Mobility Overal bed mobility: Needs Assistance Bed Mobility: Supine to Sit;Sit to Supine     Supine to sit: Min guard Sit to supine: Min assist (with Rt LE to get onto bed. )      Transfers Overall transfer level: Needs assistance Equipment used: Rolling walker (2 wheeled) Transfers: Sit to/from Stand Sit to Stand: From elevated surface;Min guard         General transfer comment: pt able to seat prosthesis in sitting, using 1 hand on rw and 1 hand from bed.  Ambulation/Gait Ambulation/Gait assistance: Min guard Ambulation Distance (Feet): 70 Feet Assistive device: Rolling walker (2 wheeled) Gait Pattern/deviations: Step-through pattern;Decreased step length - right;Decreased step length - left Gait velocity: decreased   General Gait Details: no chair follow, cues as needed for posture   Stairs            Wheelchair Mobility    Modified Rankin (Stroke  Patients Only)       Balance Overall balance assessment: Needs assistance Sitting-balance support: No upper extremity supported Sitting balance-Leahy Scale: Fair     Standing balance support: Bilateral upper extremity supported Standing balance-Leahy Scale: Poor Standing balance comment: using rw                    Cognition Arousal/Alertness: Awake/alert Behavior During Therapy: WFL for tasks assessed/performed Overall Cognitive Status: Within Functional Limits for tasks assessed                      Exercises      General Comments        Pertinent Vitals/Pain Pain Assessment: Faces Faces Pain Scale: Hurts little more Pain Location: rt hip Pain Descriptors / Indicators: Guarding;Grimacing Pain Intervention(s): Limited activity within patient's tolerance;Monitored during session    Home Living                      Prior Function            PT Goals (current goals can now be found in the care plan section) Acute Rehab PT Goals Patient Stated Goal: Get home tomorrow PT Goal Formulation: With patient Time For Goal Achievement: 01/17/16 Potential to Achieve Goals: Good Progress towards PT goals: Progressing toward goals    Frequency    7X/week      PT Plan Current plan remains appropriate    Co-evaluation  End of Session Equipment Utilized During Treatment: Gait belt Activity Tolerance: Patient tolerated treatment well;Patient limited by fatigue Patient left: in bed;with call bell/phone within reach     Time: 1453-1512 PT Time Calculation (min) (ACUTE ONLY): 19 min  Charges:  $Gait Training: 8-22 mins                    G Codes:      Francisco Ibarra, PT, CSCS Pager [phone] Office [phone]  01/04/2016, 3:23 PM

## 2016-01-04 NOTE — Progress Notes (Signed)
Physical Therapy Treatment Patient Details Name: Francisco SjogrenWalter Ibarra MRN: [id number] DOB: 1955-08-19 Today's Date: 01/04/2016    History of Present Illness Pt is a 60 y.o. male now s/p Rt anterior THA. PMH: Lt BKA, CABG, HTN, DM, anxiety.     PT Comments    Pt making gradual progress with PT intervention including bed mobility and ambulation. Able to ambulate 60 ft with rw, but limited by fatigue. PT to continue to follow and work on functional mobility, gait, and HEP. Continue to recommend HHPT services following acute stay.   Follow Up Recommendations  Home health PT;Supervision for mobility/OOB     Equipment Recommendations  None recommended by PT    Recommendations for Other Services       Precautions / Restrictions Precautions Precautions: Fall Precaution Comments: Lt BKA, has insulin pump Required Braces or Orthoses: Other Brace/Splint Other Brace/Splint: Lt BKA prosthetic Restrictions Weight Bearing Restrictions: Yes RLE Weight Bearing: Weight bearing as tolerated    Mobility  Bed Mobility Overal bed mobility: Needs Assistance Bed Mobility: Supine to Sit     Supine to sit: Min assist Sit to supine: Min assist   General bed mobility comments: min assist with Rt LE to EOB  Transfers Overall transfer level: Needs assistance Equipment used: Rolling walker (2 wheeled) Transfers: Sit to/from Stand Sit to Stand: From elevated surface;Min guard         General transfer comment: Having to stand to seat Lt prosthesis, ambulating following. Pt having to use bilateral UEs on rw to stand.   Ambulation/Gait Ambulation/Gait assistance: Min guard Ambulation Distance (Feet): 60 Feet Assistive device: Rolling walker (2 wheeled) Gait Pattern/deviations: Step-to pattern Gait velocity: decreased   General Gait Details: cues for posture, chair in tow   Stairs            Wheelchair Mobility    Modified Rankin (Stroke Patients Only)       Balance Overall  balance assessment: Needs assistance Sitting-balance support: No upper extremity supported Sitting balance-Leahy Scale: Fair     Standing balance support: Bilateral upper extremity supported Standing balance-Leahy Scale: Poor Standing balance comment: using rw                    Cognition Arousal/Alertness: Awake/alert Behavior During Therapy: WFL for tasks assessed/performed Overall Cognitive Status: Within Functional Limits for tasks assessed                      Exercises Total Joint Exercises Ankle Circles/Pumps: AROM;Right;20 reps Quad Sets: Strengthening;Right;10 reps Short Arc Quad: Strengthening;Right;10 reps Heel Slides: AAROM;Right;10 reps Hip ABduction/ADduction: Strengthening;Right;10 reps (limited range, mod assist)    General Comments        Pertinent Vitals/Pain Pain Assessment: 0-10 Pain Score: 6  Pain Location: rt hip Pain Descriptors / Indicators: Aching Pain Intervention(s): Limited activity within patient's tolerance;Monitored during session    Home Living Family/patient expects to be discharged to:: Private residence Living Arrangements: Spouse/significant other;Other relatives Available Help at Discharge: Family;Available 24 hours/day Type of Home: House Home Access: Stairs to enter Entrance Stairs-Rails: None Home Layout: One level Home Equipment: Walker - 2 wheels;Crutches;Shower seat;Toilet riser;Wheelchair - manual Additional Comments: lives with spouse and brother    Prior Function Level of Independence: Independent          PT Goals (current goals can now be found in the care plan section) Acute Rehab PT Goals Patient Stated Goal: Get home tomorrow PT Goal Formulation: With patient Time For Goal Achievement:  01/17/16 Potential to Achieve Goals: Good Progress towards PT goals: Progressing toward goals    Frequency    7X/week      PT Plan Current plan remains appropriate    Co-evaluation              End of Session Equipment Utilized During Treatment: Gait belt Activity Tolerance: Patient tolerated treatment well;Patient limited by fatigue Patient left: in chair;with call bell/phone within reach     Time: 0917-0958 PT Time Calculation (min) (ACUTE ONLY): 41 min  Charges:  $Gait Training: 8-22 mins $Therapeutic Exercise: 8-22 mins $Therapeutic Activity: 8-22 mins                    G Codes:      Christiane HaBenjamin J. Seniya Stoffers, PT, CSCS Pager [phone] Office [phone]  01/04/2016, 10:30 AM

## 2016-01-04 NOTE — Evaluation (Signed)
Occupational Therapy Evaluation Patient Details Name: Francisco Ibarra MRN: [id number] DOB: 21-Sep-1955 Today's Date: 01/04/2016    History of Present Illness Pt is a 60 y.o. male now s/p Rt anterior THA. PMH: Lt BKA, CABG, HTN, DM, anxiety.    Clinical Impression   Pt. Has decreased I and safety with performing ADLs and mobility. Pt. Is an agreement to work with OT to increase performance and safety with self care and mobility. Pt. Does want HHOT as well to increase function and safety in home environment.     Follow Up Recommendations  Home health OT    Equipment Recommendations       Recommendations for Other Services       Precautions / Restrictions Precautions Precautions: Fall Precaution Comments: Lt BKA, has insulin pump, HEP provided Required Braces or Orthoses: Other Brace/Splint Other Brace/Splint: Lt BKA prosthetic Restrictions Weight Bearing Restrictions: Yes RLE Weight Bearing: Weight bearing as tolerated      Mobility Bed Mobility Overal bed mobility: Needs Assistance Bed Mobility: Supine to Sit;Sit to Supine     Supine to sit: Supervision Sit to supine: Min assist   General bed mobility comments: assist to bring R LE into bed.  Transfers       Sit to Stand: Min assist              Balance                                            ADL Overall ADL's : Needs assistance/impaired Eating/Feeding: Independent   Grooming: Wash/dry hands;Wash/dry face;Oral care;Set up;Sitting   Upper Body Bathing: Set up;Sitting   Lower Body Bathing: Moderate assistance;Sit to/from stand   Upper Body Dressing : Set up;Sitting   Lower Body Dressing: Moderate assistance;With adaptive equipment;Sit to/from stand   Toilet Transfer: Minimal assistance           Functional mobility during ADLs: Minimal assistance General ADL Comments: Pt. wants to work on increasing I with donning and doffing of prostheic leg     Vision      Perception     Praxis      Pertinent Vitals/Pain Pain Assessment: 0-10 Pain Score: 7  Pain Location: Rt hip Pain Descriptors / Indicators: Aching;Burning;Stabbing Pain Intervention(s): Limited activity within patient's tolerance     Hand Dominance     Extremity/Trunk Assessment Upper Extremity Assessment Upper Extremity Assessment: Overall WFL for tasks assessed           Communication Communication Communication: No difficulties   Cognition Arousal/Alertness: Awake/alert Behavior During Therapy: WFL for tasks assessed/performed Overall Cognitive Status: Within Functional Limits for tasks assessed                     General Comments       Exercises       Shoulder Instructions      Home Living Family/patient expects to be discharged to:: Private residence Living Arrangements: Spouse/significant other;Other relatives Available Help at Discharge: Family;Available 24 hours/day Type of Home: House Home Access: Stairs to enter Enter[employer] CorporationEntrance Stairs-Number of Steps: 1 Entrance Stairs-Rails: None Home Layout: One level     Bathroom Shower/Tub: Walk-in Pensions consultantshower;Curtain   Bathroom Toilet: Standard     Home Equipment: Environmental consultantWalker - 2 wheels;Crutches;Shower seat;Toilet riser;Wheelchair - manual   Additional Comments: lives with spouse and brother      Prior Functioning/Environment  Level of Independence: Independent                 OT Problem List: Decreased activity tolerance;Decreased knowledge of use of DME or AE;Pain   OT Treatment/Interventions: Self-care/ADL training;DME and/or AE instruction;Therapeutic activities    OT Goals(Current goals can be found in the care plan section) Acute Rehab OT Goals Patient Stated Goal: To get stonger and go homel OT Goal Formulation: With patient Time For Goal Achievement: 01/18/16 Potential to Achieve Goals: Good ADL Goals Pt Will Perform Lower Body Bathing: with modified independence;sit to/from stand;with  adaptive equipment Pt Will Perform Lower Body Dressing: with modified independence;sit to/from stand;with adaptive equipment Pt Will Transfer to Toilet: with modified independence;ambulating;bedside commode Pt Will Perform Toileting - Clothing Manipulation and hygiene: with modified independence;sit to/from stand Pt Will Perform Tub/Shower Transfer: Shower transfer;ambulating;rolling walker;shower seat  OT Frequency: Min 2X/week   Barriers to D/C:            Co-evaluation              End of Session Equipment Utilized During Treatment: Gait belt;Rolling walker  Activity Tolerance: Patient tolerated treatment well Patient left: in bed;with call bell/phone within reach   Time: 0800-0838 OT Time Calculation (min): 38 min Charges:  OT General Charges $OT Visit: 1 Procedure OT Evaluation $OT Eval Moderate Complexity: 1 Procedure OT Treatments $Self Care/Home Management : 8-22 mins G-Codes:    Quincy Prisco 01/04/2016, 8:39 AM

## 2016-01-04 NOTE — Progress Notes (Signed)
Subjective: 2 Days Post-Op Procedure(s) (LRB): RIGHT TOTAL HIP ARTHROPLASTY ANTERIOR APPROACH (Right) Patient reports pain as moderate.  Denies chest pain ,dizziness or light headedness. Slow progress with PT.   Objective: Vital signs in last 24 hours: Temp:  [98.2 F (36.8 C)-99.1 F (37.3 C)] 98.5 F (36.9 C) (12/21 0608) Pulse Rate:  [104-115] 115 (12/21 0608) Resp:  [16-18] 16 (12/21 0608) BP: (113-137)/(63-76) 129/76 (12/21 0608) SpO2:  [96 %-98 %] 96 % (12/21 16100608)  Intake/Output from previous day: 12/20 0701 - 12/21 0700 In: 2397.5 [P.O.:480; I.V.:1917.5] Out: 1300 [Urine:1300] Intake/Output this shift: Total I/O In: 120 [P.O.:120] Out: -    Recent Labs  01/03/16 0544  HGB 11.0*    Recent Labs  01/03/16 0544  WBC 9.6  RBC 3.50*  HCT 31.8*  PLT 126*    Recent Labs  01/03/16 0544  NA 131*  K 4.9  CL 101  CO2 22  BUN 14  CREATININE 1.21  GLUCOSE 311*  CALCIUM 8.8*   No results for input(s): LABPT, INR in the last 72 hours.   Right lower extremity: Neurovascular intact Incision: dressing C/D/I Compartment soft  Assessment/Plan: 2 Days Post-Op Procedure(s) (LRB): RIGHT TOTAL HIP ARTHROPLASTY ANTERIOR APPROACH (Right) Up with therapy  Probable discharge to home tomorrow .   Tovah Slavick 01/04/2016, 12:19 PM

## 2016-01-04 NOTE — Discharge Instructions (Signed)

## 2016-01-05 LAB — GLUCOSE, CAPILLARY
GLUCOSE-CAPILLARY: 151 mg/dL — AB (ref 65–99)
Glucose-Capillary: 160 mg/dL — ABNORMAL HIGH (ref 65–99)

## 2016-01-05 NOTE — Telephone Encounter (Signed)
They are aware no protocol, they just wanted to let us know they were taking care of him

## 2016-01-05 NOTE — Progress Notes (Signed)
Patient ID: Francisco Ibarra, male   DOB: 08/28/1955, 60 y.o.   MRN: [id number] Doing well.  Can be discharged to home today.

## 2016-01-05 NOTE — Discharge Summary (Signed)
Patient ID: Francisco Ibarra Somes MRN: [id number] DOB/AGE: Oct 02, 1955 60 y.o.  Admit date: 01/02/2016 Discharge date: 01/05/2016  Admission Diagnoses:  Principal Problem:   Unilateral primary osteoarthritis, right hip Active Problems:   Status post total replacement of right hip   Discharge Diagnoses:  Same  Past Medical History:  Diagnosis Date  . Anxiety   . Arthritis   . Diabetes mellitus without complication (HCC)   . Hypertension   . Hyperthyroidism     Surgeries: Procedure(s): RIGHT TOTAL HIP ARTHROPLASTY ANTERIOR APPROACH on 01/02/2016   Consultants:   Discharged Condition: Improved  Hospital Course: Francisco Ibarra Kelty is an 60 y.o. male who was admitted 01/02/2016 for operative treatment ofUnilateral primary osteoarthritis, right hip. Patient has severe unremitting pain that affects sleep, daily activities, and work/hobbies. After pre-op clearance the patient was taken to the operating room on 01/02/2016 and underwent  Procedure(s): RIGHT TOTAL HIP ARTHROPLASTY ANTERIOR APPROACH.    Patient was given perioperative antibiotics: Anti-infectives    Start     Dose/Rate Route Frequency Ordered Stop   01/02/16 2100  ceFAZolin (ANCEF) IVPB 2g/100 mL premix     2 g 200 mL/hr over 30 Minutes Intravenous Every 6 hours 01/02/16 1947 01/03/16 0316   01/02/16 0600  ceFAZolin (ANCEF) 3 g in dextrose 5 % 50 mL IVPB     3 g 130 mL/hr over 30 Minutes Intravenous On call to O.R. 01/01/16 1335 01/02/16 1714       Patient was given sequential compression devices, early ambulation, and chemoprophylaxis to prevent DVT.  Patient benefited maximally from hospital stay and there were no complications.    Recent vital signs: Patient Vitals for the past 24 hrs:  BP Temp Temp src Pulse Resp SpO2  01/05/16 0500 113/60 98.4 F (36.9 C) Oral 99 16 95 %  01/04/16 2049 126/73 99.1 F (37.3 C) Oral (!) 101 16 100 %  01/04/16 1500 124/74 98.3 F (36.8 C) Oral (!) 108 16 98 %     Recent  laboratory studies:  Recent Labs  01/03/16 0544  WBC 9.6  HGB 11.0*  HCT 31.8*  PLT 126*  NA 131*  K 4.9  CL 101  CO2 22  BUN 14  CREATININE 1.21  GLUCOSE 311*  CALCIUM 8.8*     Discharge Medications:   Allergies as of 01/05/2016      Reactions   Codeine Anaphylaxis, Hives, Shortness Of Breath   Per patient, no problems taking oxycodone in the past. Apryl, PharmD 12/2015       Medication List    STOP taking these medications   aspirin EC 81 MG tablet Replaced by:  aspirin 81 MG chewable tablet   cyclobenzaprine 10 MG tablet Commonly known as:  FLEXERIL   HYDROcodone-acetaminophen 10-325 MG tablet Commonly known as:  NORCO     TAKE these medications   amitriptyline 10 MG tablet Commonly known as:  ELAVIL Take 10 mg by mouth at bedtime.   amLODipine 5 MG tablet Commonly known as:  NORVASC Take 5 mg by mouth at bedtime.   aspirin 81 MG chewable tablet Chew 1 tablet (81 mg total) by mouth 2 (two) times daily. Replaces:  aspirin EC 81 MG tablet   atorvastatin 40 MG tablet Commonly known as:  LIPITOR Take 40 mg by mouth at bedtime.   BIOTIN PO Take 1 tablet by mouth daily.   escitalopram 10 MG tablet Commonly known as:  LEXAPRO Take 10 mg by mouth daily.   esomeprazole 20 MG capsule Commonly  known as:  NEXIUM Take 20 mg by mouth daily.   FISH OIL PO Take 1 capsule by mouth daily.   gabapentin 800 MG tablet Commonly known as:  NEURONTIN Take 800 mg by mouth 3 (three) times daily.   insulin lispro 100 UNIT/ML injection Commonly known as:  HUMALOG Inject into the skin. Insulin Pump Bolus based on carb intake , basal 3.25 units per hour   insulin pump Soln Inject into the skin. Humalog 100/ml basal rate 3.25 units per hour, bolus based on carb intake   lisinopril 40 MG tablet Commonly known as:  PRINIVIL,ZESTRIL Take 40 mg by mouth at bedtime.   MAGNESIUM PO Take 1 tablet by mouth daily.   meloxicam 15 MG tablet Commonly known as:   MOBIC Take 15 mg by mouth daily.   metFORMIN 500 MG 24 hr tablet Commonly known as:  GLUCOPHAGE-XR Take 1,000 mg by mouth 2 (two) times daily.   methocarbamol 500 MG tablet Commonly known as:  ROBAXIN Take 1 tablet (500 mg total) by mouth every 6 (six) hours as needed for muscle spasms.   multivitamin with minerals Tabs tablet Take 1 tablet by mouth daily.   oxyCODONE-acetaminophen 5-325 MG tablet Commonly known as:  PERCOCET/ROXICET Take 1-2 tablets by mouth every 4 (four) hours as needed for moderate pain or severe pain.   SYNTHROID 200 MCG tablet Generic drug:  levothyroxine Take 200 mcg by mouth daily.   VICTOZA 18 MG/3ML Sopn Generic drug:  liraglutide Inject 1.8 mg into the skin daily.   ZOHYDRO ER 15 MG C12a Generic drug:  HYDROcodone Bitartrate Take 15 mg by mouth every 12 (twelve) hours.   zolpidem 10 MG tablet Commonly known as:  AMBIEN Take 10 mg by mouth at bedtime.            Durable Medical Equipment        Start     Ordered   01/02/16 1948  DME Walker rolling  Once    Question:  Patient needs a walker to treat with the following condition  Answer:  Status post total replacement of right hip   01/02/16 1947   01/02/16 1948  DME 3 n 1  Once     01/02/16 1947      Diagnostic Studies: Dg Pelvis Portable  Result Date: 01/02/2016 CLINICAL DATA:  Right hip replacement EXAM: PORTABLE PELVIS 1-2 VIEWS COMPARISON:  01/02/2016 fluoroscopic spot images. FINDINGS: Right total hip prosthesis in place without complicating feature. Moderate axial loss of articular space in the left hip with considerable spurring a left femoral head and acetabulum. Questionable early protrusio on the left. IMPRESSION: 1. No complicating feature with respect to the right hip prosthesis. 2. Considerable left hip arthropathy. Electronically Signed   By: Gaylyn Rong M.D.   On: 01/02/2016 19:09   Dg C-arm 1-60 Min  Result Date: 01/02/2016 CLINICAL DATA:  Elective surgery  EXAM: OPERATIVE right HIP (WITH PELVIS IF PERFORMED) 2 VIEWS TECHNIQUE: Fluoroscopic spot image(s) were submitted for interpretation post-operatively. COMPARISON:  None. FINDINGS: Frontal projections of the right hip and right pelvis demonstrate a right total hip prosthesis in place without visible fracture or complicating feature. Pubic rami appear intact. IMPRESSION: 1. Right total hip prosthesis observed without complicating feature identified on fluoroscopic spot images. Electronically Signed   By: Gaylyn Rong M.D.   On: 01/02/2016 19:05   Dg Hip Operative Unilat With Pelvis Right  Result Date: 01/02/2016 CLINICAL DATA:  Elective surgery EXAM: OPERATIVE right HIP (WITH PELVIS  IF PERFORMED) 2 VIEWS TECHNIQUE: Fluoroscopic spot image(s) were submitted for interpretation post-operatively. COMPARISON:  None. FINDINGS: Frontal projections of the right hip and right pelvis demonstrate a right total hip prosthesis in place without visible fracture or complicating feature. Pubic rami appear intact. IMPRESSION: 1. Right total hip prosthesis observed without complicating feature identified on fluoroscopic spot images. Electronically Signed   By: Gaylyn RongWalter  Liebkemann M.D.   On: 01/02/2016 19:05    Disposition: to home  Discharge Instructions    Call MD / Call 911    Complete by:  As directed    If you experience chest pain or shortness of breath, CALL 911 and be transported to the hospital emergency room.  If you develope a fever above 101 F, pus (white drainage) or increased drainage or redness at the wound, or calf pain, call your surgeon's office.   Constipation Prevention    Complete by:  As directed    Drink plenty of fluids.  Prune juice may be helpful.  You may use a stool softener, such as Colace (over the counter) 100 mg twice a day.  Use MiraLax (over the counter) for constipation as needed.   Diet - low sodium heart healthy    Complete by:  As directed    Discharge patient    Complete by:   As directed    Increase activity slowly as tolerated    Complete by:  As directed       Follow-up Information    Kathryne Hitchhristopher Y Roan Sawchuk, MD Follow up in 2 week(s).   Specialty:  Orthopedic Surgery Contact information: [address] [place] [address] [phone]            Signed: Kathryne HitchChristopher Y Nakesha Ebrahim 01/05/2016, 6:39 AM

## 2016-01-05 NOTE — Progress Notes (Signed)
Physical Therapy Treatment Patient Details Name: Francisco SjogrenWalter Ibarra MRN: [id number] DOB: June 14, 1955 Today's Date: 01/05/2016    History of Present Illness Pt is a 60 y.o. male now s/p Rt anterior THA. PMH: Lt BKA, CABG, HTN, DM, anxiety.     PT Comments    Patient is progressing well toward mobility goals and tolerated increased gait distance and stair training this session. Reviewed HEP. Current plan remains appropriate.   Follow Up Recommendations  Home health PT;Supervision for mobility/OOB     Equipment Recommendations  None recommended by PT    Recommendations for Other Services       Precautions / Restrictions Precautions Precautions: Fall Precaution Comments: Lt BKA, has insulin pump Required Braces or Orthoses: Other Brace/Splint Other Brace/Splint: Lt BKA prosthetic Restrictions Weight Bearing Restrictions: Yes RLE Weight Bearing: Weight bearing as tolerated    Mobility  Bed Mobility Overal bed mobility: Needs Assistance Bed Mobility: Supine to Sit;Sit to Supine     Supine to sit: Min guard Sit to supine: Min assist   General bed mobility comments: assist to bring R LE into bed; cues for technique to assist with dog leash to elevate R LE; pt attempted with use of gait belt but unable at this time  Transfers Overall transfer level: Needs assistance Equipment used: Rolling walker (2 wheeled) Transfers: Sit to/from Stand Sit to Stand: Min guard         General transfer comment: min guard for safety; no physical assist needed  Ambulation/Gait Ambulation/Gait assistance: Supervision Ambulation Distance (Feet): 120 Feet Assistive device: Rolling walker (2 wheeled) Gait Pattern/deviations: Step-through pattern;Decreased stride length Gait velocity: decreased   General Gait Details: cues for posture and proximity of RW; pt with slow, steady gait   Stairs Stairs: Yes   Stair Management: No rails;Forwards;Step to pattern;With walker Number of Stairs:  1 General stair comments: cues for sequencing/technique; pt with good safety awareness and with carry over of sequencing from previous therapy  Wheelchair Mobility    Modified Rankin (Stroke Patients Only)       Balance Overall balance assessment: Needs assistance Sitting-balance support: No upper extremity supported Sitting balance-Leahy Scale: Good     Standing balance support: Bilateral upper extremity supported Standing balance-Leahy Scale: Poor Standing balance comment: using rw                    Cognition Arousal/Alertness: Awake/alert Behavior During Therapy: WFL for tasks assessed/performed Overall Cognitive Status: Within Functional Limits for tasks assessed                      Exercises Total Joint Exercises Ankle Circles/Pumps: AROM;Both;10 reps Quad Sets: AROM;Both;5 reps Heel Slides: AROM;Right;10 reps    General Comments        Pertinent Vitals/Pain Pain Assessment: 0-10 Pain Score: 6  Faces Pain Scale: Hurts little more Pain Location: rt hip Pain Descriptors / Indicators: Sore Pain Intervention(s): Limited activity within patient's tolerance;Monitored during session;Repositioned;Patient requesting pain meds-RN notified;Ice applied    Home Living                      Prior Function            PT Goals (current goals can now be found in the care plan section) Acute Rehab PT Goals Patient Stated Goal: go home PT Goal Formulation: With patient Time For Goal Achievement: 01/17/16 Potential to Achieve Goals: Good Progress towards PT goals: Progressing toward goals    Frequency  7X/week      PT Plan Current plan remains appropriate    Co-evaluation             End of Session Equipment Utilized During Treatment: Gait belt Activity Tolerance: Patient tolerated treatment well Patient left: in bed;with call bell/phone within reach     Time: 0950-1020 PT Time Calculation (min) (ACUTE ONLY): 30  min  Charges:  $Gait Training: 8-22 mins $Therapeutic Activity: 8-22 mins                    G Codes:      Derek MoundKellyn R Dorota Heinrichs Khloey Chern, PTA Pager: [phone]   01/05/2016, 10:28 AM

## 2016-01-05 NOTE — Care Management Note (Addendum)
Case Management Note  Patient Details  Name: Francisco Ibarra MRN: [id number] Date of Birth: August 21, 1955  Subjective/Objective:   60 year old gentleman s/p right total hip arthroplasty. Patient has old left BKA.   Action/Plan:Case manager spoke with patient and wife on 01/03/16 concerning discharge plans. Choice for home health agency was offered. [facility]. Patient has all necessary DME from previous surgeries. He will have family support at discharge.                    Expected Discharge Date:    01/05/16              Expected Discharge Plan:  Home w Home Health Services  In-House Referral:  NA  Discharge planning Services  CM Consult  Post Acute Care Choice:  Durable Medical Equipment, Home Health Choice offered to:  Patient  DME Arranged:  N/A DME Agency:  NA  HH Arranged:  PT HH Agency:  [facility]  Status of Service:  Completed, signed off  If discussed at Long Length of Stay Meetings, dates discussed:    Additional Comments:  Durenda GuthrieBrady, Francisco Faison [name], Francisco Ibarra 01/05/2016, 7:37 AM

## 2016-01-05 NOTE — Progress Notes (Signed)
Occupational Therapy Treatment Patient Details Name: Francisco SjogrenWalter Ibarra MRN: [id number] DOB: [birth year] Today's Date: 01/05/2016    History of present illness Pt is a 60 y.o. male now s/p Rt anterior THA. PMH: Lt BKA, CABG, HTN, DM, anxiety.    OT comments  Pt. Was agreeable to theapy. Pt. Was able to sit EOB at S level. Pt. Was able to don prosthetic legs at S level to don and min guard assist to place. Pt. [facility] perform [facility] pivot with walker to 3-1 commode. Pt. Was min guard assist with clohting management. Pt. Was able to perform stand to sit at S level. Pt. Was min guard assist with sit to stand from commode and to transfer back to bed with walker. Pt. Was Min A with donning sock and was S to doff sock. Pt. Was S to don and doff shoe on R LE with AE.   Follow Up Recommendations  Home health OT    Equipment Recommendations  None recommended by OT    Recommendations for Other Services      Precautions / Restrictions Precautions Precautions: Fall Precaution Comments: Lt BKA, has insulin pump Required Braces or Orthoses: Other Brace/Splint Other Brace/Splint: Lt BKA prosthetic Restrictions Weight Bearing Restrictions: Yes RLE Weight Bearing: Weight bearing as tolerated       Mobility Bed Mobility Overal bed mobility: Needs Assistance Bed Mobility: Supine to Sit;Sit to Supine     Supine to sit: Supervision Sit to supine: Min assist (with Rt LE to get onto bed. )      Transfers Overall transfer level: Needs assistance Equipment used: Rolling walker (2 wheeled) Transfers: Sit to/from Stand Sit to Stand: From elevated surface;Min guard         General transfer comment: pt able to seat prosthesis in sitting, using 1 hand on rw and 1 hand from bed.    Balance                                   ADL                       Lower Body Dressing: Minimal assistance   Toilet Transfer: Min guard           Functional mobility during ADLs: Min guard;Rolling walker        Vision                     Perception     Praxis      Cognition   Behavior During Therapy: WFL for tasks assessed/performed Overall Cognitive Status: Within Functional Limits for tasks assessed                       Extremity/Trunk Assessment               Exercises     Shoulder Instructions       General Comments      Pertinent Vitals/ Pain       Pain Assessment: 0-10 Pain Score: 7  Pain Location: rt hip Pain Descriptors / Indicators: Aching;Sharp Pain Intervention(s): Limited activity within patient's tolerance  Home Living  Prior Functioning/Environment              Frequency           Progress Toward Goals  OT Goals(current goals can now be found in the care plan section)     Acute Rehab OT Goals Patient Stated Goal:  (to go home today) ADL Goals Pt Will Perform Lower Body Dressing: with min assist Pt Will Transfer to Toilet: with min guard assist Pt Will Perform Toileting - Clothing Manipulation and hygiene: with min guard assist  Plan Discharge plan remains appropriate    Co-evaluation                 End of Session Equipment Utilized During Treatment: Gait belt;Rolling walker   Activity Tolerance Patient tolerated treatment well   Patient Left in bed;with call bell/phone within reach   Nurse Communication          Time: [id number] OT Time Calculation (min): 47 min  Charges: OT General Charges $OT Visit: 1 Procedure OT Treatments $Self Care/Home Management : 38-52 mins  Azir Muzyka 01/05/2016, 9:40 AM

## 2016-01-10 ENCOUNTER — Telehealth (INDEPENDENT_AMBULATORY_CARE_PROVIDER_SITE_OTHER): Payer: Self-pay | Admitting: *Deleted

## 2016-01-10 NOTE — Telephone Encounter (Signed)
[facility] calling for Physical therapy requesting 2X a week for 8 weeks

## 2016-01-10 NOTE — Telephone Encounter (Signed)
I called Francisco Ibarra to give home health physical, patient status post total joint replacement.

## 2016-01-16 ENCOUNTER — Inpatient Hospital Stay (INDEPENDENT_AMBULATORY_CARE_PROVIDER_SITE_OTHER): Payer: Self-pay | Admitting: Orthopaedic Surgery

## 2016-01-17 ENCOUNTER — Ambulatory Visit (INDEPENDENT_AMBULATORY_CARE_PROVIDER_SITE_OTHER): Payer: BLUE CROSS/BLUE SHIELD | Admitting: Orthopaedic Surgery

## 2016-01-17 DIAGNOSIS — Z96641 Presence of right artificial hip joint: Secondary | ICD-10-CM

## 2016-01-17 MED ORDER — OXYCODONE-ACETAMINOPHEN 5-325 MG PO TABS: 1.0000 | ORAL_TABLET | Freq: Four times a day (QID) | ORAL | 0 refills | Status: AC | PRN

## 2016-01-17 NOTE — Progress Notes (Signed)
The patient is now 2 weeks status post a right total hip arthroplasty. He is doing well.  On examination his staple line looks good arm and the staples and placed Steri-Strips. He does have a moderate seroma and I was able to lean hip with Betadine and alcohol and remove 80 mL of seroma fluid. It was no evidence infection. He is ambulating with a walker. He still has home health therapy coming to him. He does have a history of a left below-knee prosthesis.  At this point a continued increase his activities. I do not need to see him back for a month unless he is having issues. At his next follow-up visit we do not need x-rays.

## 2016-01-19 ENCOUNTER — Telehealth (INDEPENDENT_AMBULATORY_CARE_PROVIDER_SITE_OTHER): Payer: Self-pay | Admitting: *Deleted

## 2016-01-19 NOTE — Telephone Encounter (Signed)
[facility] called to let Dr know pt canceled his physical therapy appt today because of family a emergency.

## 2016-01-19 NOTE — Telephone Encounter (Signed)
FYI See message concerning patient canceling Physical Therapy. Thank You

## 2016-02-14 ENCOUNTER — Encounter (INDEPENDENT_AMBULATORY_CARE_PROVIDER_SITE_OTHER): Payer: Self-pay

## 2016-02-14 ENCOUNTER — Ambulatory Visit (INDEPENDENT_AMBULATORY_CARE_PROVIDER_SITE_OTHER): Payer: BLUE CROSS/BLUE SHIELD | Admitting: Orthopaedic Surgery

## 2016-02-14 DIAGNOSIS — Z96641 Presence of right artificial hip joint: Secondary | ICD-10-CM

## 2016-02-14 MED ORDER — METHOCARBAMOL 500 MG PO TABS: 500.0000 mg | ORAL_TABLET | Freq: Three times a day (TID) | ORAL | 1 refills | Status: AC | PRN

## 2016-02-14 NOTE — Progress Notes (Signed)
Patient is doing quite well at 6 weeks status post a right total hip arthroplasty through direct injury approach. He is someone who has a left below-knee amputation and he uses a prosthesis on that leg. 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 He has no complaints.  On examination his incision is almost healed completely. There is a small opening at the top but there is no evidence infection and is treating this with Triple Antibiotic ointment daily. He tolerates me easily putting his hip through range of motion.  At this point he is doing so well we do not need see him back for 6 months. At that visit I would like a low AP pelvis. If he has problems before that he knows to not hesitate to give us a call.

## 2016-02-19 ENCOUNTER — Telehealth (INDEPENDENT_AMBULATORY_CARE_PROVIDER_SITE_OTHER): Payer: Self-pay | Admitting: Orthopaedic Surgery

## 2016-02-19 NOTE — Telephone Encounter (Signed)
[facility] started [facility] last week. Patient's insurance coverage ended 02/15/16 and patient was discharged  Last week. The number to contact her is [phone]

## 2016-02-20 NOTE — Telephone Encounter (Signed)
FYI...see below. This patient no longer insurance so he has been discharged from HHPT

## 2016-02-28 ENCOUNTER — Telehealth (INDEPENDENT_AMBULATORY_CARE_PROVIDER_SITE_OTHER): Payer: Self-pay | Admitting: *Deleted

## 2016-02-28 NOTE — Telephone Encounter (Signed)
[facility] calling to see if any forms were received CB:[phone]

## 2016-02-29 NOTE — Telephone Encounter (Signed)
This is a fax number, I have not yet received anything where I can fax

## 2016-03-04 ENCOUNTER — Telehealth (INDEPENDENT_AMBULATORY_CARE_PROVIDER_SITE_OTHER): Payer: Self-pay | Admitting: Orthopaedic Surgery

## 2016-03-04 NOTE — Telephone Encounter (Signed)
I haven't received anything yet. Do you have her number on your caller ID?

## 2016-03-04 NOTE — Telephone Encounter (Signed)
Francisco Ibarra was wondering if we had received any forms for the patient. [facility] [phone]

## 2016-08-14 ENCOUNTER — Ambulatory Visit (INDEPENDENT_AMBULATORY_CARE_PROVIDER_SITE_OTHER): Payer: BLUE CROSS/BLUE SHIELD | Admitting: Orthopaedic Surgery

## 2016-09-25 ENCOUNTER — Ambulatory Visit (INDEPENDENT_AMBULATORY_CARE_PROVIDER_SITE_OTHER): Payer: BLUE CROSS/BLUE SHIELD | Admitting: Orthopaedic Surgery

## 2018-01-19 IMAGING — RF DG HIP (WITH PELVIS) OPERATIVE*R*
1 series · 2 of 2 positions shown · non-contrast
Comparison: None.

CLINICAL DATA: Elective surgery

EXAM:
OPERATIVE right HIP (WITH PELVIS IF PERFORMED) 2 VIEWS
TECHNIQUE: Fluoroscopic spot image(s) were submitted for interpretation
post-operatively.

[Series 1: run · 2 of 2 slices shown]
[im 1/2]
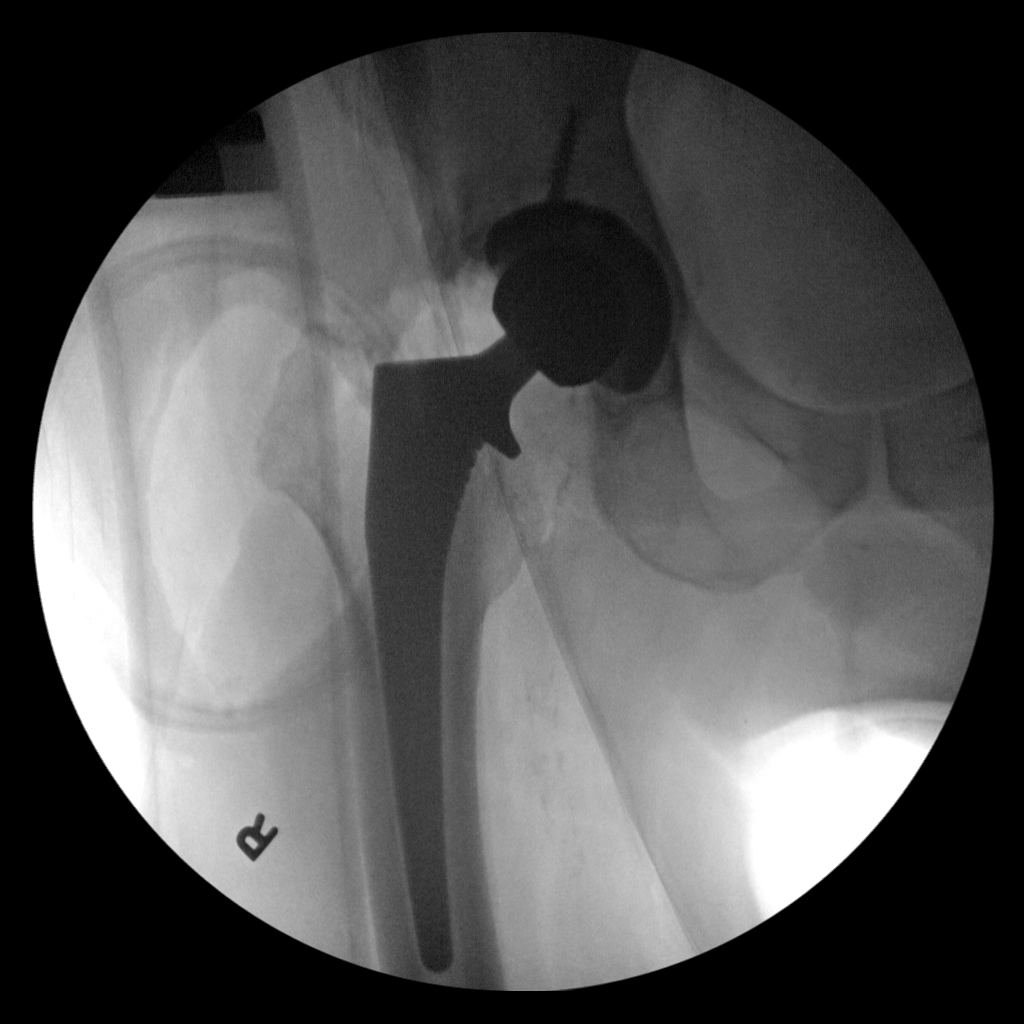
[im 2/2]
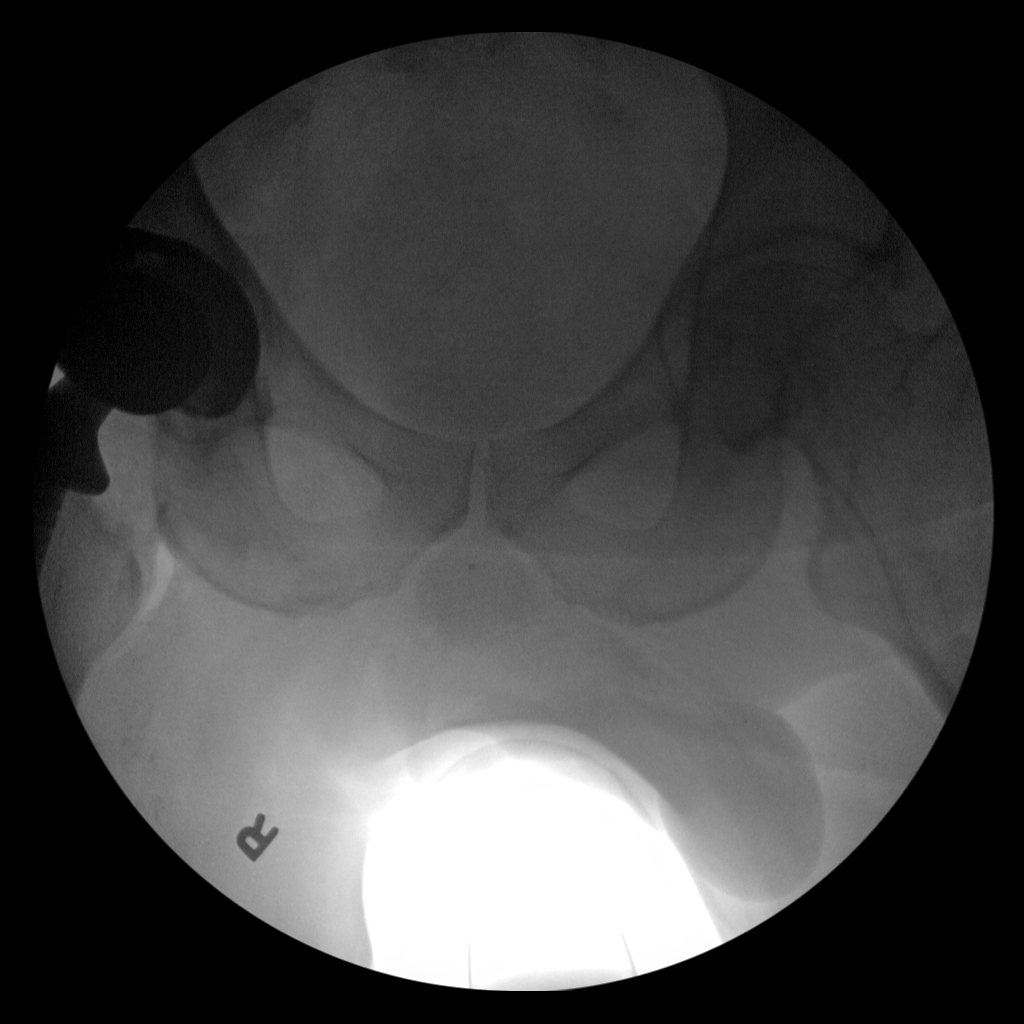

[2 of 2 positions shown; findings below may reference images not displayed]

FINDINGS: Frontal projections of the right hip and right pelvis demonstrate a
right total hip prosthesis in place without visible fracture or
complicating feature. Pubic rami appear intact.
IMPRESSION: 1. Right total hip prosthesis observed without complicating feature
identified on fluoroscopic spot images.

## 2018-01-19 IMAGING — CR DG PORTABLE PELVIS
1 series · 1 of 1 positions shown · non-contrast
Comparison: 01/02/2016 fluoroscopic spot images.

CLINICAL DATA: Right hip replacement

EXAM:
PORTABLE PELVIS 1-2 VIEWS

[AP]
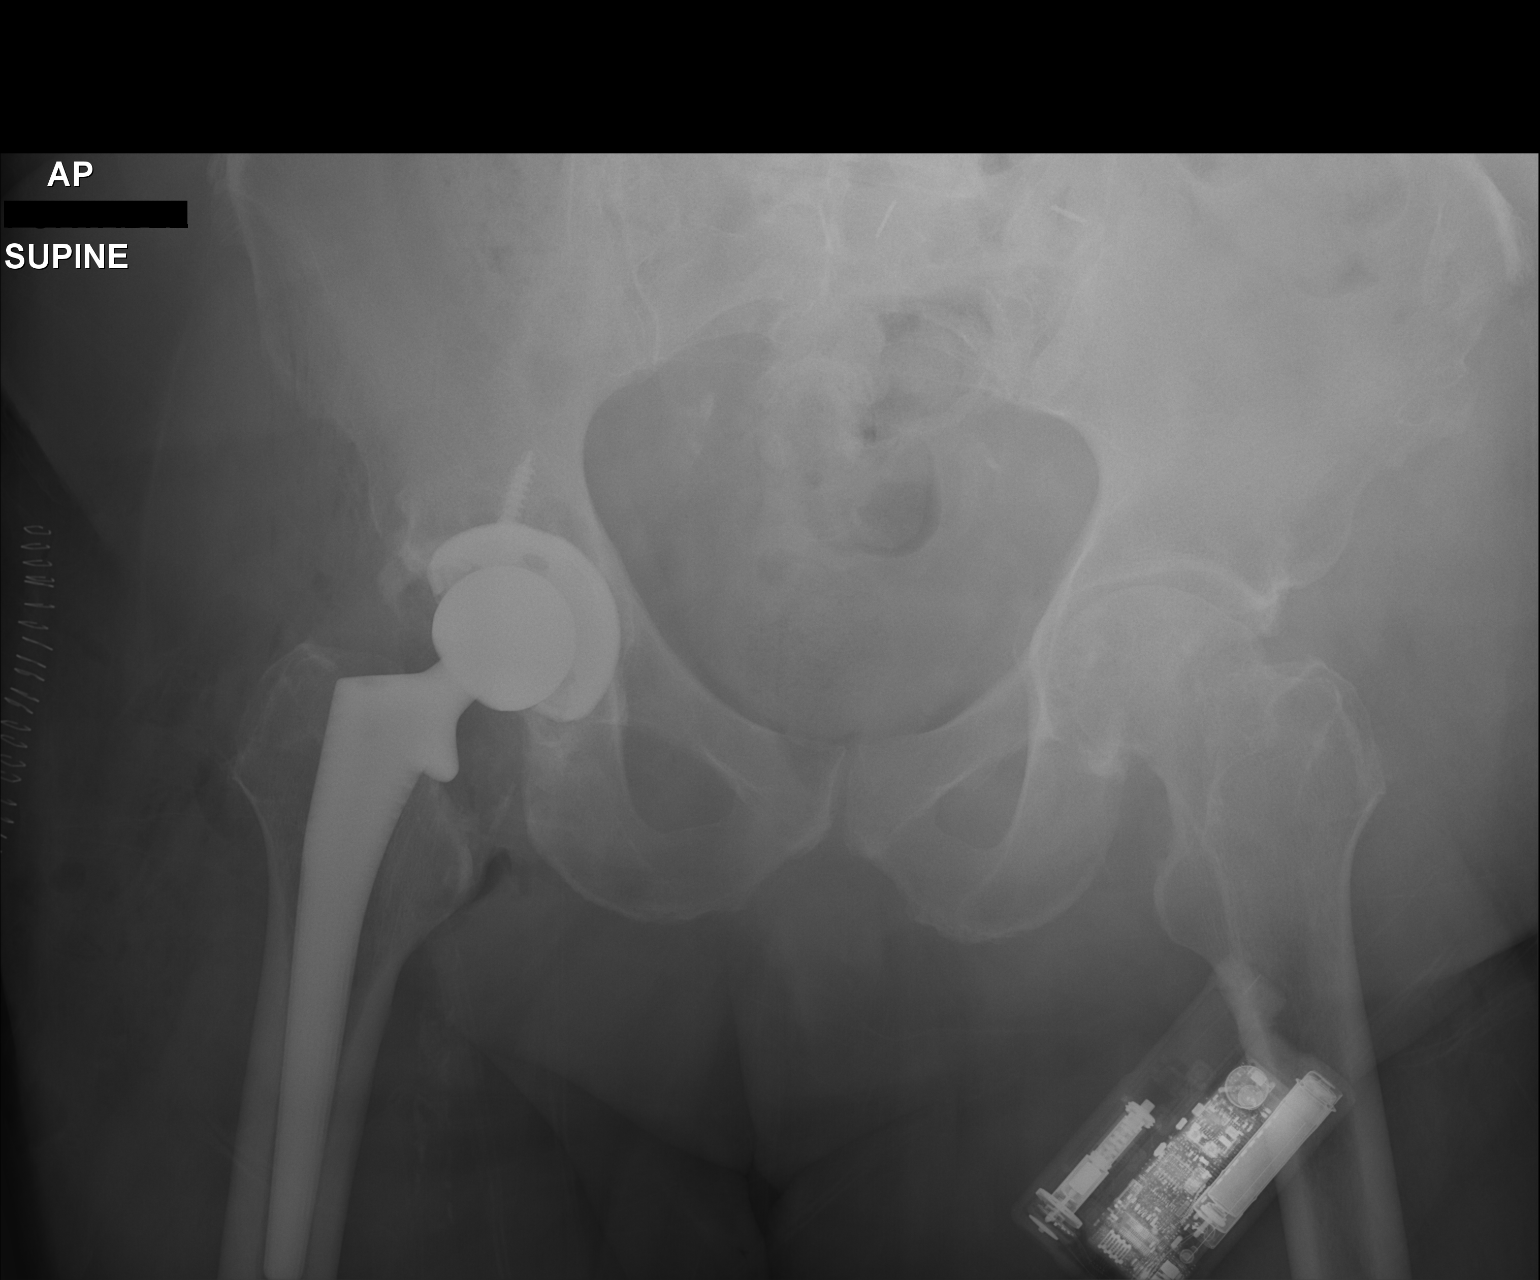

[1 of 1 positions shown; findings below may reference images not displayed]

FINDINGS: Right total hip prosthesis in place without complicating feature.

Moderate axial loss of articular space in the left hip with
considerable spurring a left femoral head and acetabulum.
Questionable early protrusio on the left.
IMPRESSION: 1. No complicating feature with respect to the right hip prosthesis.
2. Considerable left hip arthropathy.
